# Patient Record
Sex: Female | Born: 1955 | Race: Asian | Hispanic: No | Smoking: Never smoker
Health system: Southern US, Community
[De-identification: ages and names within clinical notes are randomized; demographics above are authoritative.]

## PROBLEM LIST (undated history)

## (undated) DIAGNOSIS — E782 Mixed hyperlipidemia: Secondary | ICD-10-CM

## (undated) DIAGNOSIS — E1165 Type 2 diabetes mellitus with hyperglycemia: Secondary | ICD-10-CM

## (undated) DIAGNOSIS — I10 Essential (primary) hypertension: Secondary | ICD-10-CM

## (undated) DIAGNOSIS — Z91011 Allergy to milk products: Secondary | ICD-10-CM

## (undated) DIAGNOSIS — K2100 Gastro-esophageal reflux disease with esophagitis, without bleeding: Secondary | ICD-10-CM

## (undated) HISTORY — DX: Mixed hyperlipidemia: E78.2

## (undated) HISTORY — DX: Allergy to milk products: Z91.011

## (undated) HISTORY — PX: ABDOMINAL HYSTERECTOMY: SHX81

## (undated) HISTORY — DX: Gastro-esophageal reflux disease with esophagitis, without bleeding: K21.00

## (undated) HISTORY — DX: Type 2 diabetes mellitus with hyperglycemia: E11.65

## (undated) HISTORY — DX: Essential (primary) hypertension: I10

---

## 2020-06-08 ENCOUNTER — Other Ambulatory Visit: Payer: Self-pay

## 2020-06-08 ENCOUNTER — Observation Stay: Payer: Medicaid Other

## 2020-06-08 ENCOUNTER — Emergency Department: Payer: Medicaid Other

## 2020-06-08 ENCOUNTER — Observation Stay
Admission: EM | Admit: 2020-06-08 | Discharge: 2020-06-09 | Disposition: A | Discharge status: Home / Self Care | Payer: Medicaid Other | Attending: Internal Medicine | Admitting: Internal Medicine

## 2020-06-08 ENCOUNTER — Encounter: Payer: Self-pay | Admitting: Emergency Medicine

## 2020-06-08 DIAGNOSIS — Z20822 Contact with and (suspected) exposure to covid-19: Secondary | ICD-10-CM | POA: Insufficient documentation

## 2020-06-08 DIAGNOSIS — R5383 Other fatigue: Secondary | ICD-10-CM

## 2020-06-08 DIAGNOSIS — G459 Transient cerebral ischemic attack, unspecified: Principal | ICD-10-CM | POA: Diagnosis present

## 2020-06-08 DIAGNOSIS — E785 Hyperlipidemia, unspecified: Secondary | ICD-10-CM | POA: Insufficient documentation

## 2020-06-08 DIAGNOSIS — R531 Weakness: Secondary | ICD-10-CM | POA: Diagnosis present

## 2020-06-08 DIAGNOSIS — I10 Essential (primary) hypertension: Secondary | ICD-10-CM | POA: Diagnosis not present

## 2020-06-08 LAB — DIFFERENTIAL
Abs Immature Granulocytes: 0.02 10*3/uL (ref 0.00–0.07)
Basophils Absolute: 0 10*3/uL (ref 0.0–0.1)
Basophils Relative: 1 %
Eosinophils Absolute: 0.1 10*3/uL (ref 0.0–0.5)
Eosinophils Relative: 2 %
Immature Granulocytes: 0 %
Lymphocytes Relative: 29 %
Lymphs Abs: 1.8 10*3/uL (ref 0.7–4.0)
Monocytes Absolute: 0.4 10*3/uL (ref 0.1–1.0)
Monocytes Relative: 7 %
Neutro Abs: 3.7 10*3/uL (ref 1.7–7.7)
Neutrophils Relative %: 61 %

## 2020-06-08 LAB — COMPREHENSIVE METABOLIC PANEL
ALT: 40 U/L (ref 0–44)
AST: 33 U/L (ref 15–41)
Albumin: 3.9 g/dL (ref 3.5–5.0)
Alkaline Phosphatase: 53 U/L (ref 38–126)
Anion gap: 9 (ref 5–15)
BUN: 17 mg/dL (ref 8–23)
CO2: 25 mmol/L (ref 22–32)
Calcium: 9.1 mg/dL (ref 8.9–10.3)
Chloride: 103 mmol/L (ref 98–111)
Creatinine, Ser: 0.8 mg/dL (ref 0.44–1.00)
GFR, Estimated: >60 mL/min (ref >60)
Glucose, Bld: 140 mg/dL — ABNORMAL HIGH (ref 70–99)
Potassium: 4 mmol/L (ref 3.5–5.1)
Sodium: 137 mmol/L (ref 135–145)
Total Bilirubin: 0.4 mg/dL (ref 0.3–1.2)
Total Protein: 7.5 g/dL (ref 6.5–8.1)

## 2020-06-08 LAB — RESP PANEL BY RT-PCR (FLU A&B, COVID) ARPGX2
Influenza A by PCR: NEGATIVE
Influenza B by PCR: NEGATIVE
SARS Coronavirus 2 by RT PCR: NEGATIVE

## 2020-06-08 LAB — CBC
HCT: 39.8 % (ref 36.0–46.0)
Hemoglobin: 12.7 g/dL (ref 12.0–15.0)
MCH: 25.1 pg — ABNORMAL LOW (ref 26.0–34.0)
MCHC: 31.9 g/dL (ref 30.0–36.0)
MCV: 78.7 fL — ABNORMAL LOW (ref 80.0–100.0)
Platelets: 235 10*3/uL (ref 150–400)
RBC: 5.06 MIL/uL (ref 3.87–5.11)
RDW: 14.6 % (ref 11.5–15.5)
WBC: 6 10*3/uL (ref 4.0–10.5)
nRBC: 0 % (ref 0.0–0.2)

## 2020-06-08 LAB — LDL CHOLESTEROL, DIRECT: Direct LDL: 152.5 mg/dL — ABNORMAL HIGH (ref 0–99)

## 2020-06-08 LAB — APTT: aPTT: 28 seconds (ref 24–36)

## 2020-06-08 LAB — PROTIME-INR
INR: 1 (ref 0.8–1.2)
Prothrombin Time: 12.8 seconds (ref 11.4–15.2)

## 2020-06-08 IMAGING — CT CT HEAD W/O CM
3 series · 15 of 46 positions shown, 18 images · non-contrast
Comparison: None.

CLINICAL DATA: TIA symptoms, headache

EXAM:
CT HEAD WITHOUT CONTRAST
TECHNIQUE: Contiguous axial images were obtained from the base of the skull
through the vertex without intravenous contrast.

[Series 3: head wo · axial · 0.41mm/px · z∈[-210,-90]mm · 9 of 29 slices shown, 12 images]
[im 3/29  brain]
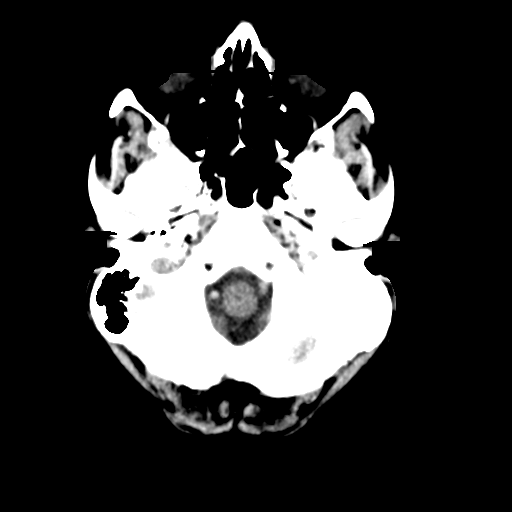
[im 3/29  bone]
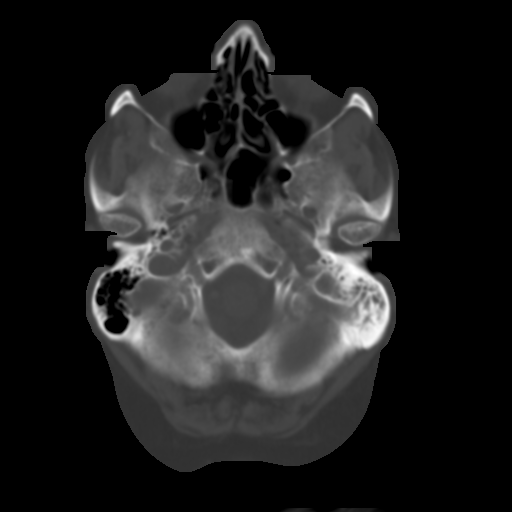
[im 6/29  brain]
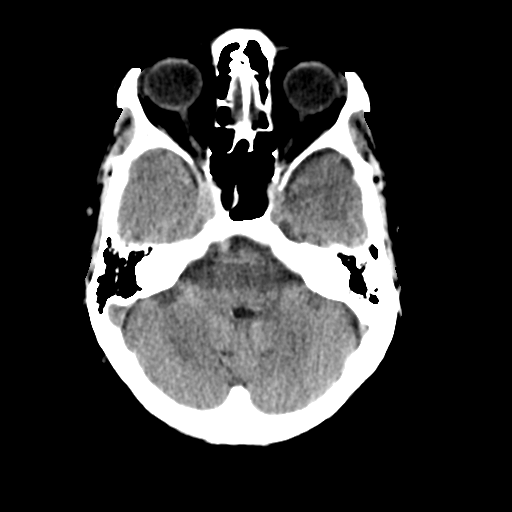
[im 9/29  brain]
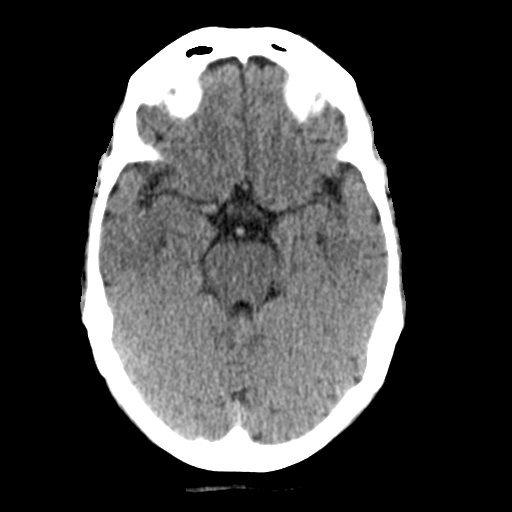
[im 12/29  brain]
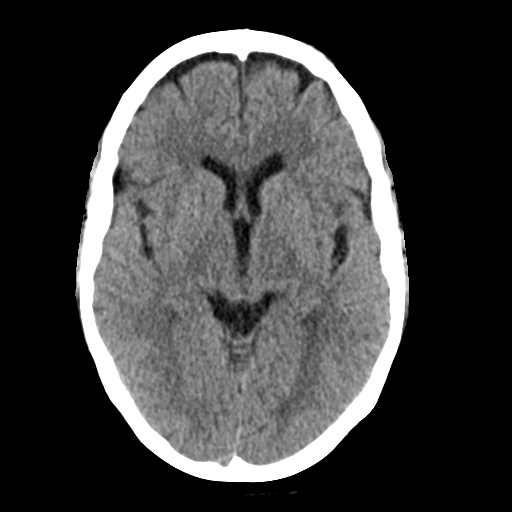
[im 15/29  brain]
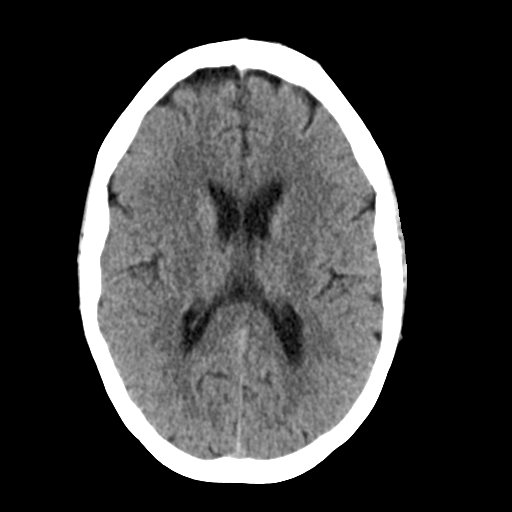
[im 15/29  bone]
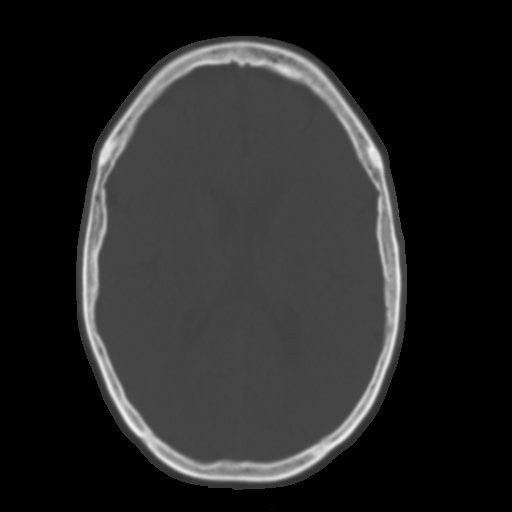
[im 18/29  brain]
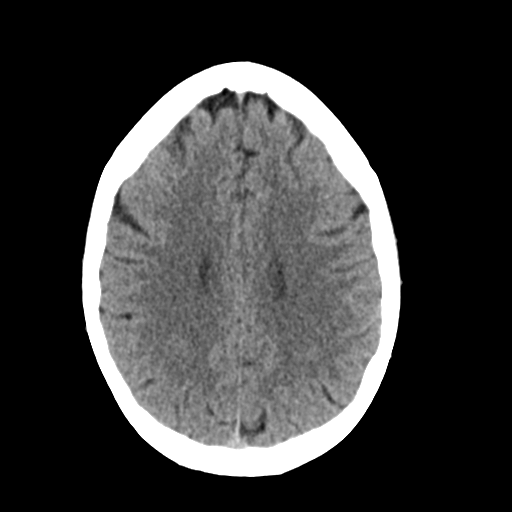
[im 21/29  brain]
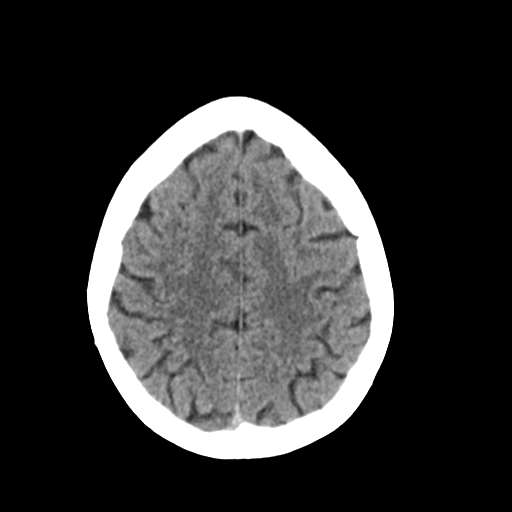
[im 24/29  brain]
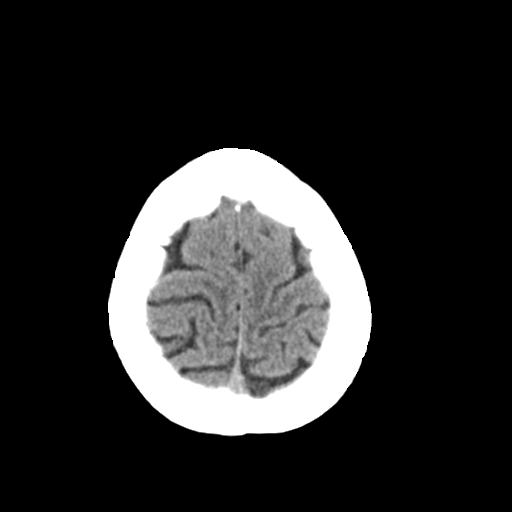
[im 27/29  brain]
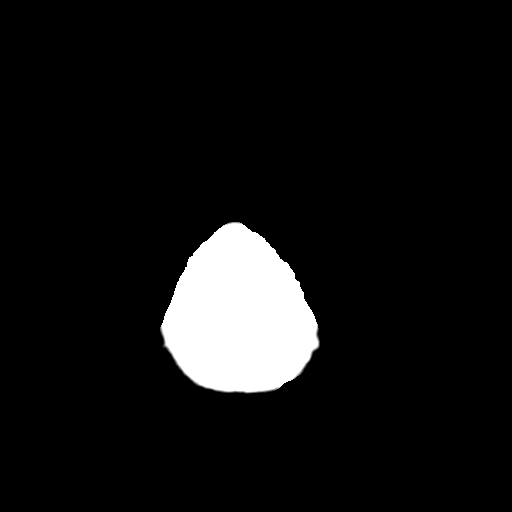
[im 27/29  bone]
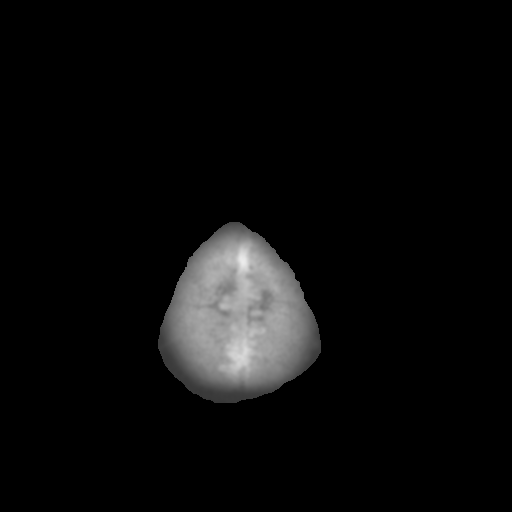

[Series 4: coronal soft tissue · coronal · 0.29mm/px · 3 of 68 slices shown]
[im 23/68  brain]
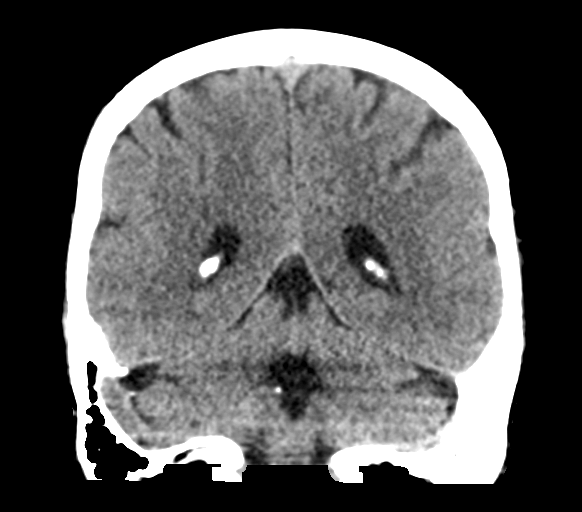
[im 30/68  brain]
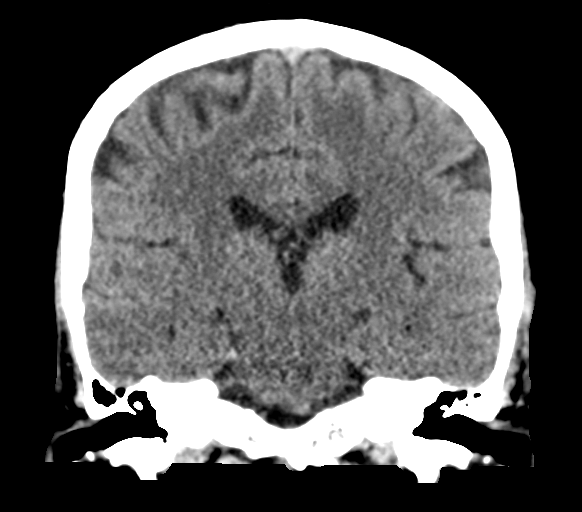
[im 38/68  brain]
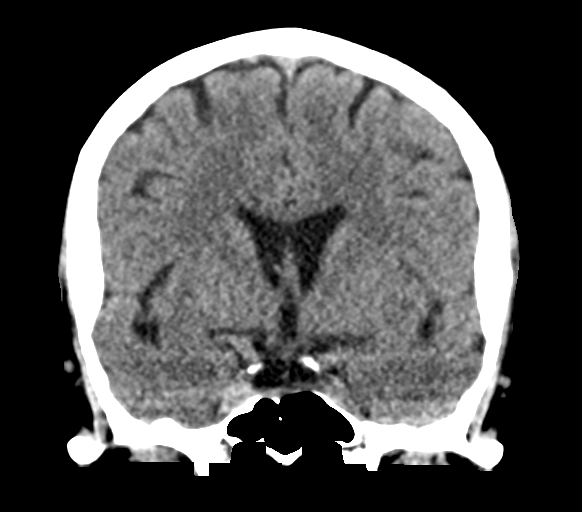

[Series 5: sagittal soft tissue · sagittal · 0.30mm/px · 3 of 56 slices shown]
[im 19/56  brain]
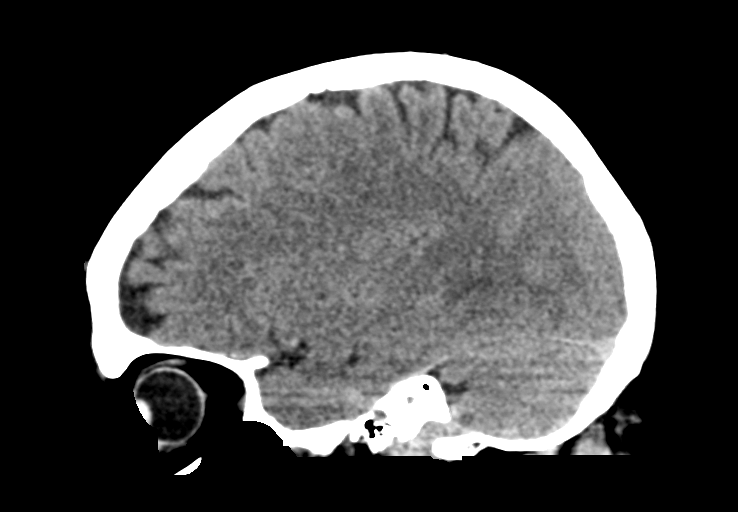
[im 28/56  brain]
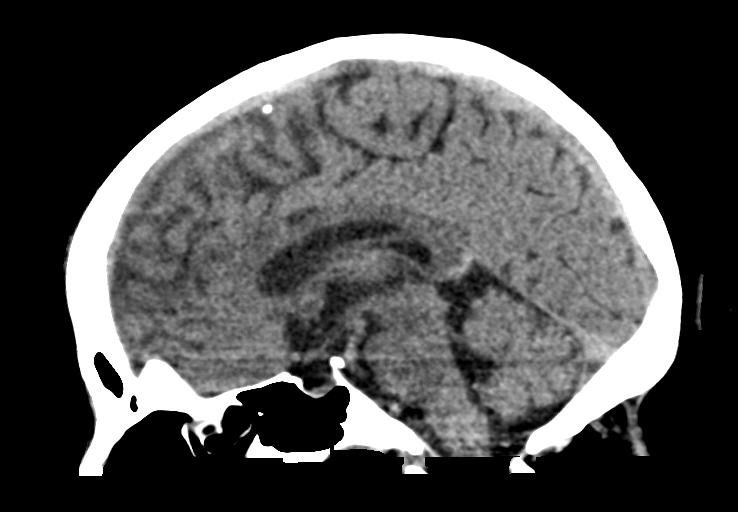
[im 37/56  brain]
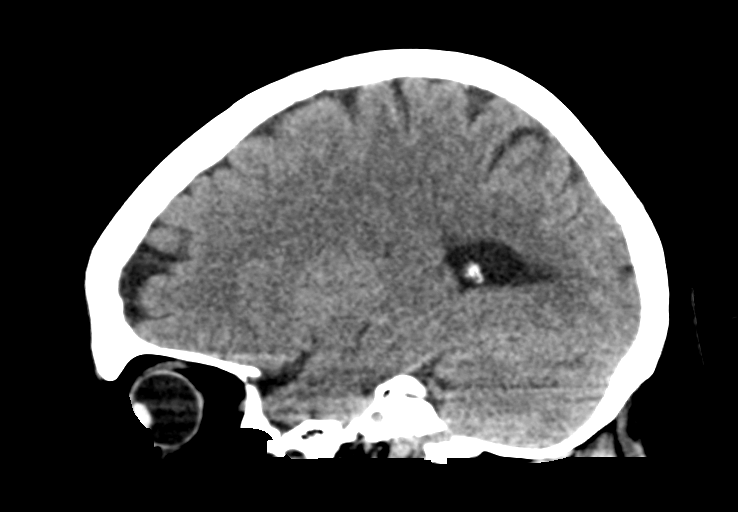

[15 of 46 positions shown; findings below may reference images not displayed]

FINDINGS: Brain: No evidence of acute infarction, hemorrhage, hydrocephalus,
extra-axial collection or mass lesion/mass effect.

Vascular: No hyperdense vessel or unexpected calcification.

Skull: Normal. Negative for fracture or focal lesion.

Sinuses/Orbits: No acute finding.

Other: None.
IMPRESSION: Normal head CT without contrast for age.

## 2020-06-08 IMAGING — MR MR HEAD W/O CM
12 series · 48 of 48 positions shown · IV contrast (gadavist)
Comparison: Head CT earlier same day

CLINICAL DATA: Left-sided weakness, headache and speech
disturbance.

EXAM:
MRI HEAD WITHOUT CONTRAST
MRA HEAD WITHOUT CONTRAST
MRA NECK WITHOUT AND WITH CONTRAST
TECHNIQUE: Multiplanar, multiecho pulse sequences of the brain and surrounding
structures were obtained without and with intravenous contrast.
Angiographic images of the Circle of Willis were obtained using MRA
technique without intravenous contrast. Angiographic images of the
neck were obtained using MRA technique without and with intravenous
contrast. Carotid stenosis measurements (when applicable) are
obtained utilizing NASCET criteria, using the distal internal
carotid diameter as the denominator.
CONTRAST:  7.5mL GADAVIST GADOBUTROL 1 MMOL/ML IV SOLN

[Series 5: ax dwi_tracew · axial · 3.0mm · 0.65mm/px · z∈[-139,+15]mm · 4 of 48 slices shown]
[im 1/48]
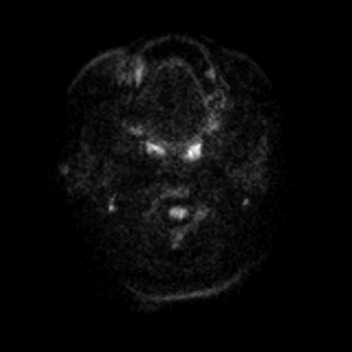
[im 16/48]
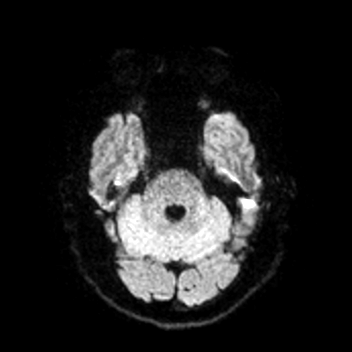
[im 32/48]
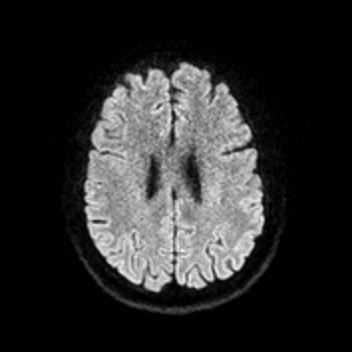
[im 48/48]
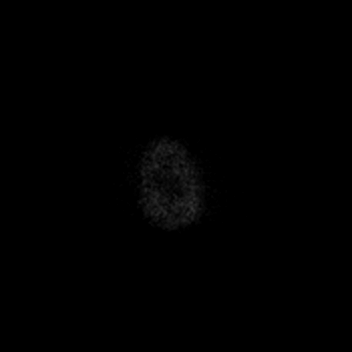

[Series 6: ax dwi_adc · axial · 3.0mm · 0.65mm/px · z∈[-139,+15]mm · 4 of 48 slices shown]
[im 1/48]
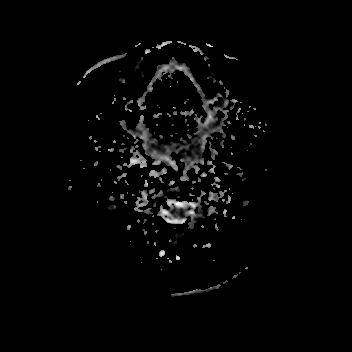
[im 16/48]
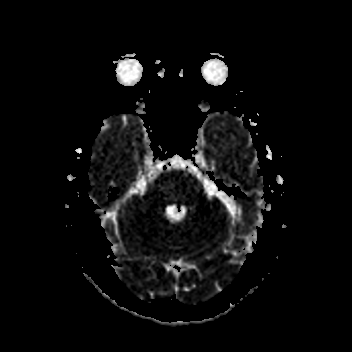
[im 32/48]
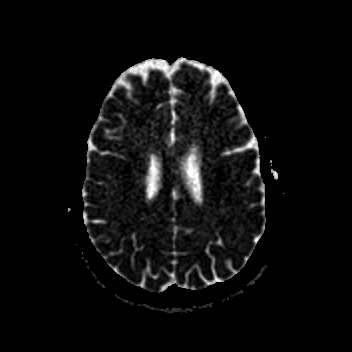
[im 48/48]
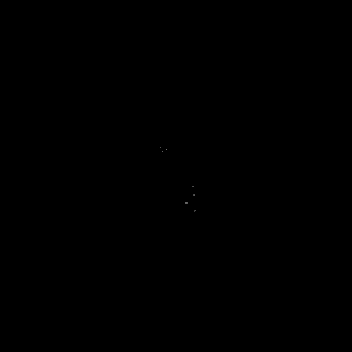

[Series 7: cor dwi_tracew · coronal · 5.0mm · 0.68mm/px · 3 of 40 slices shown]
[im 1/40]
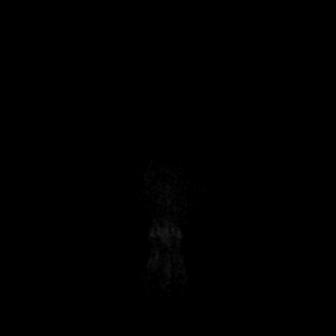
[im 20/40]
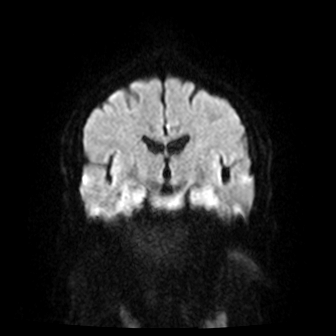
[im 40/40]
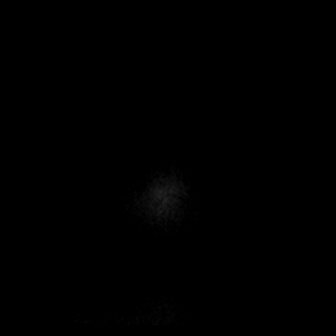

[Series 8: cor dwi_adc · coronal · 5.0mm · 0.68mm/px · 3 of 39 slices shown]
[im 1/39]
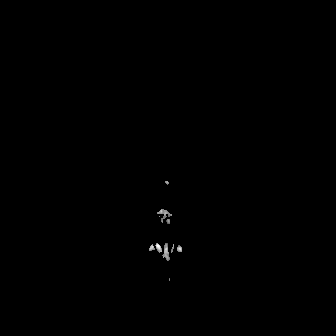
[im 20/39]
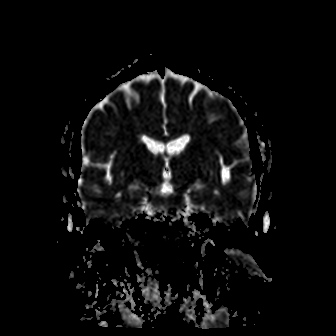
[im 39/39]
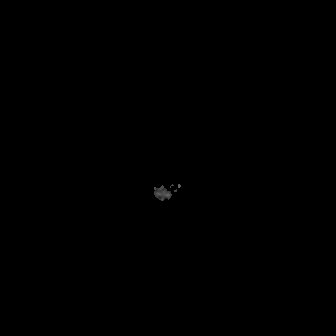

[Series 9: T1 · sagittal · 5.0mm · 0.62mm/px · 2 of 22 slices shown (1 of 2)]
[im 1/22]
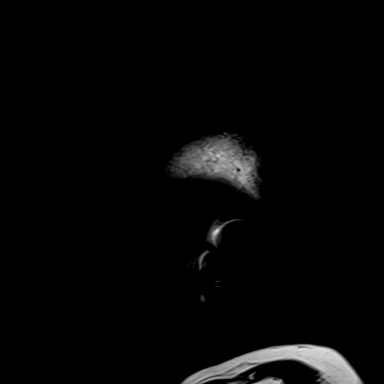
[im 22/22]
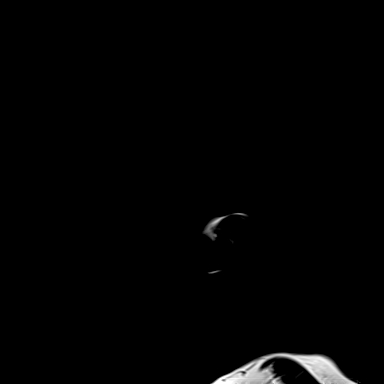

[Series 10: T2 · axial · 5.0mm · 0.53mm/px · z∈[-132,+10]mm · 2 of 25 slices shown (1 of 2)]
[im 1/25]
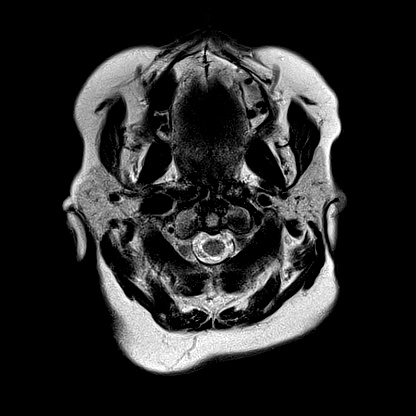
[im 25/25]
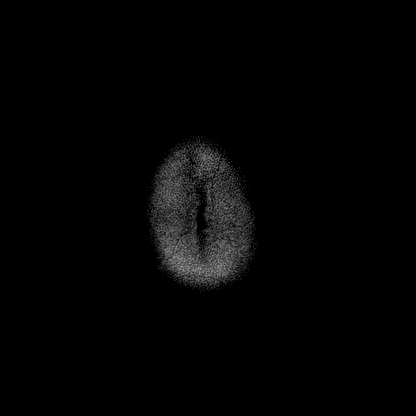

[Series 11: mag_images · axial · 3.0mm · 0.90mm/px · z∈[-148,+27]mm · 4 of 60 slices shown]
[im 1/60]
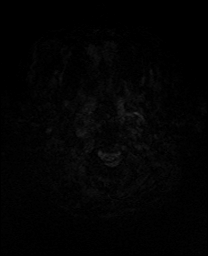
[im 20/60]
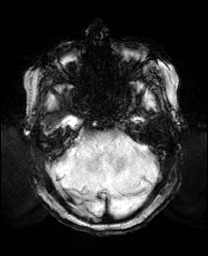
[im 40/60]
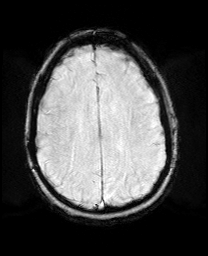
[im 60/60]
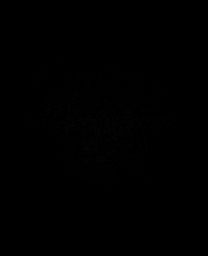

[Series 12: pha_images · axial · 3.0mm · 0.90mm/px · z∈[-148,+12]mm · 4 of 55 slices shown]
[im 1/55]
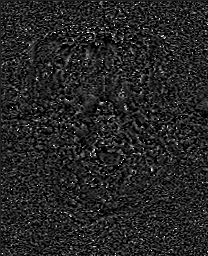
[im 19/55]
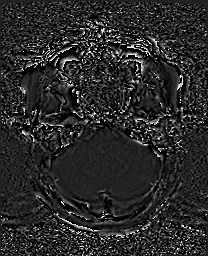
[im 37/55]
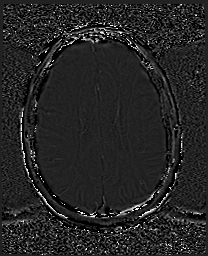
[im 55/55]
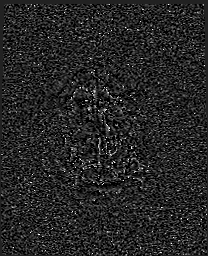

[Series 13: swi_images · axial · 3.0mm · 0.90mm/px · z∈[-148,+27]mm · 4 of 60 slices shown]
[im 1/60]
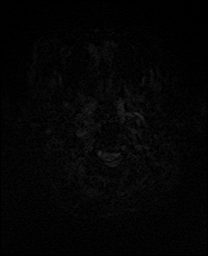
[im 20/60]
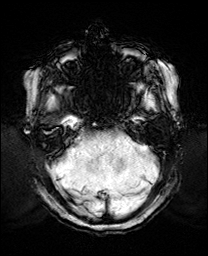
[im 40/60]
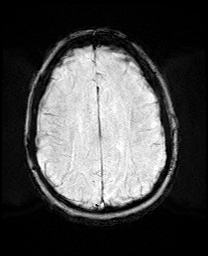
[im 60/60]
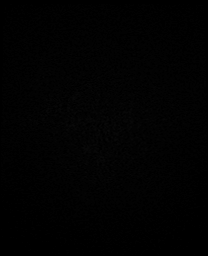

[Series 15: FLAIR · axial · 3.0mm · 0.53mm/px · z∈[-140,+20]mm · 4 of 55 slices shown]
[im 1/55]
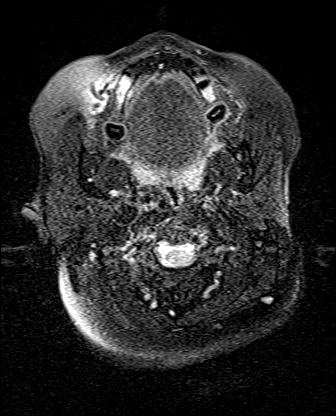
[im 19/55]
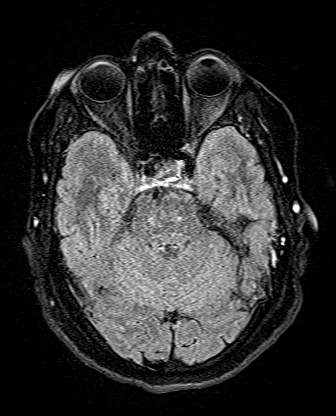
[im 37/55]
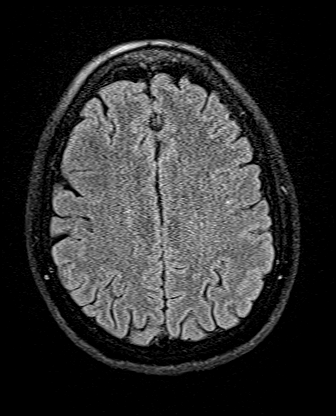
[im 55/55]
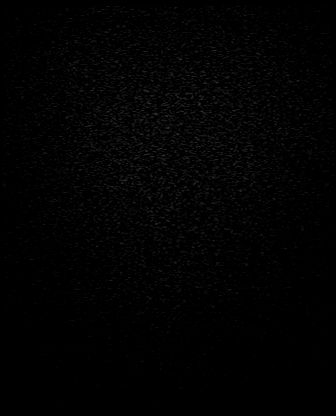

[Series 16: T1 · axial · 1.0mm · 0.98mm/px · z∈[-133,+24]mm · 12 of 160 slices shown (2 of 2)]
[im 1/160]
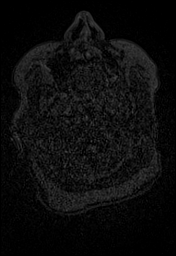
[im 15/160]
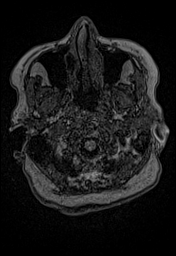
[im 29/160]
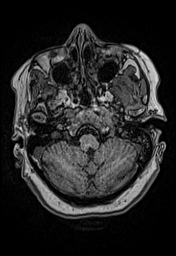
[im 44/160]
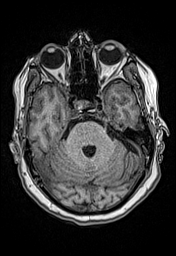
[im 58/160]
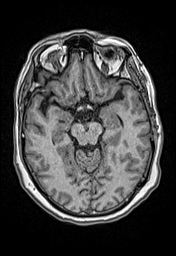
[im 73/160]
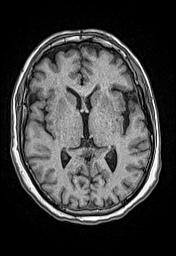
[im 87/160]
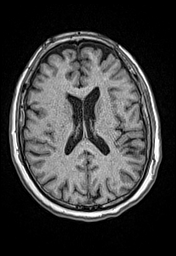
[im 102/160]
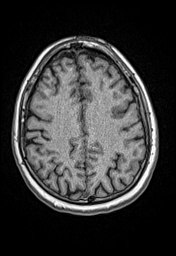
[im 116/160]
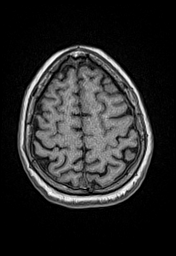
[im 131/160]
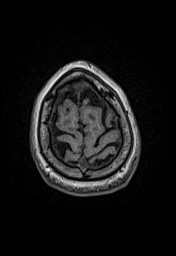
[im 145/160]
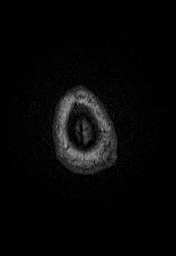
[im 160/160]
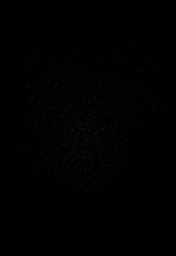

[Series 17: T2 · coronal · 5.0mm · 0.57mm/px · 2 of 29 slices shown (2 of 2)]
[im 1/29]
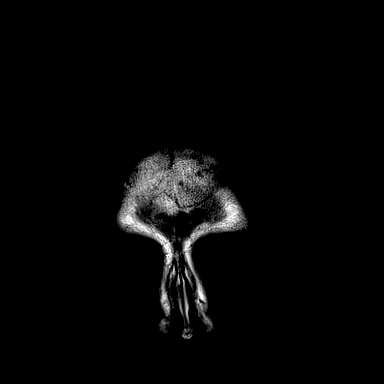
[im 29/29]
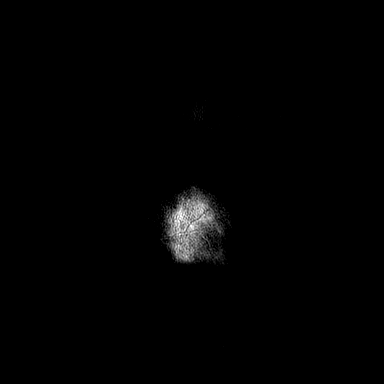

[48 of 48 positions shown; findings below may reference images not displayed]

FINDINGS: MRI HEAD FINDINGS

Brain: Diffusion imaging does not show any acute or subacute
infarction. There are mild chronic small-vessel ischemic changes of
the pons. Very few foci of T2 and FLAIR signal within the
hemispheric white matter consistent with minimal small vessel
change. No cortical or large vessel territory infarction. No mass
lesion, hemorrhage, hydrocephalus or extra-axial collection.

Vascular: Major vessels at the base of the brain show flow.

Skull and upper cervical spine: Negative

Sinuses/Orbits: Paranasal sinuses are clear.  Orbits appear normal.

Other: Left mastoid effusion.

MRA HEAD FINDINGS

Both internal carotid arteries widely patent through the skull base
and siphon regions. There is either an infundibulum or a 2 mm
aneurysm at the left posterior communicating artery origin. Anterior
and middle cerebral vessels appear normal.

Both vertebral arteries widely patent to the basilar. No basilar
stenosis. Posterior circulation branch vessels are patent. Some
irregularity of the more distal PCA branches.

MRA NECK FINDINGS

Branching pattern is normal. No origin stenosis. Both common carotid
arteries widely patent to the bifurcation. The vessels are tortuous
as might be seen with hypertension. No carotid bifurcation stenosis
or irregularity. Cervical internal carotid arteries are tortuous but
widely patent. No sign of dissection.

Both vertebral artery origins are poorly seen because of chest
motion. Beyond the origins, the vessels are widely patent through
the cervical region to the foramen magnum. The vessels are somewhat
tortuous but there is no sign of vertebral dissection.
IMPRESSION: MRI head: No acute finding. Mild chronic small-vessel change of the
pons and cerebral hemispheric white matter.

MRA head: No large or medium vessel occlusion. 2 mm aneurysm or
infundibulum at the left posterior communicating artery origin.

MRA neck: No sign of dissection. The vessels are tortuous, as might
be seen with a history of hypertension. No carotid bifurcation
stenosis or irregularity. Vertebral artery origins are not well seen
due to chest motion.

## 2020-06-08 IMAGING — MR MR MRA HEAD W/O CM
1 series · 18 of 48 positions shown · IV contrast (gadavist)
Comparison: Head CT earlier same day

CLINICAL DATA: Left-sided weakness, headache and speech
disturbance.

EXAM:
MRI HEAD WITHOUT CONTRAST
MRA HEAD WITHOUT CONTRAST
MRA NECK WITHOUT AND WITH CONTRAST
TECHNIQUE: Multiplanar, multiecho pulse sequences of the brain and surrounding
structures were obtained without and with intravenous contrast.
Angiographic images of the Circle of Willis were obtained using MRA
technique without intravenous contrast. Angiographic images of the
neck were obtained using MRA technique without and with intravenous
contrast. Carotid stenosis measurements (when applicable) are
obtained utilizing NASCET criteria, using the distal internal
carotid diameter as the denominator.
CONTRAST:  7.5mL GADAVIST GADOBUTROL 1 MMOL/ML IV SOLN

[Series 1: TOF · axial · 0.5mm · 0.41mm/px · z∈[-126,-29]mm · 18 of 205 slices shown]
[im 1/205]
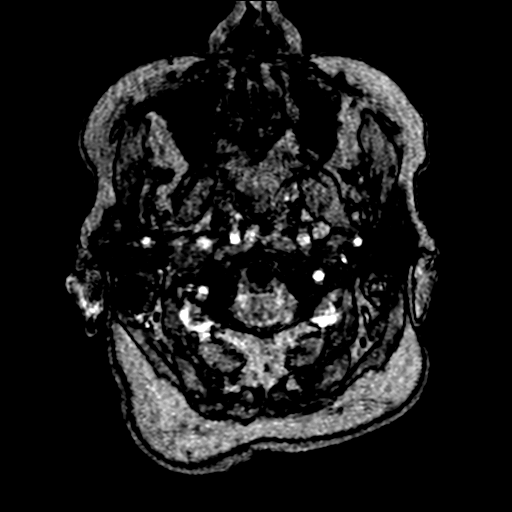
[im 5/205]
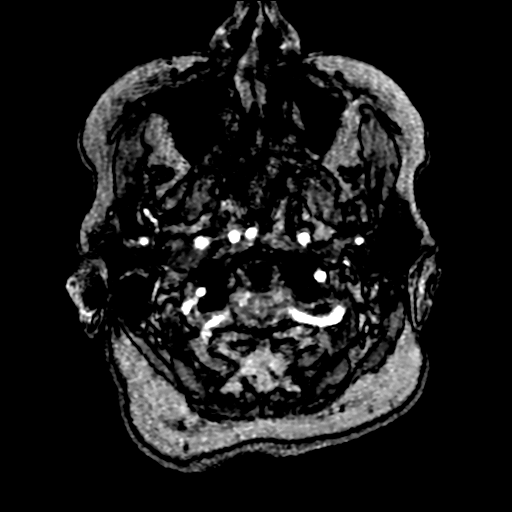
[im 9/205]
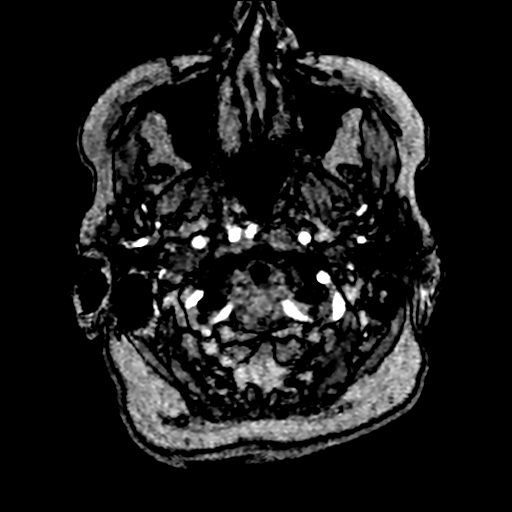
[im 14/205]
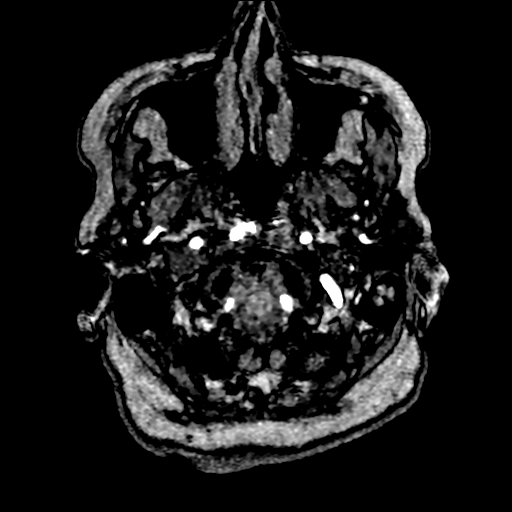
[im 18/205]
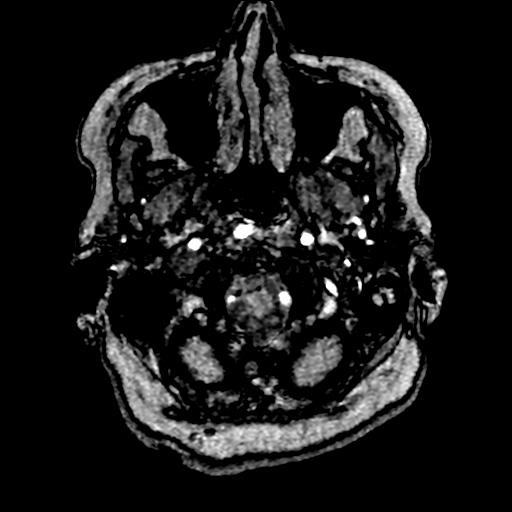
[im 22/205]
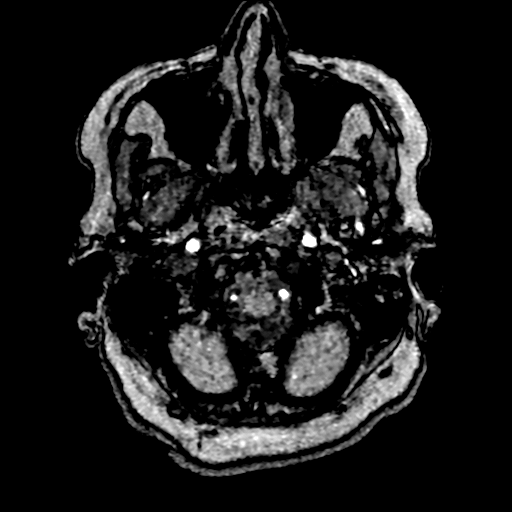
[im 27/205]
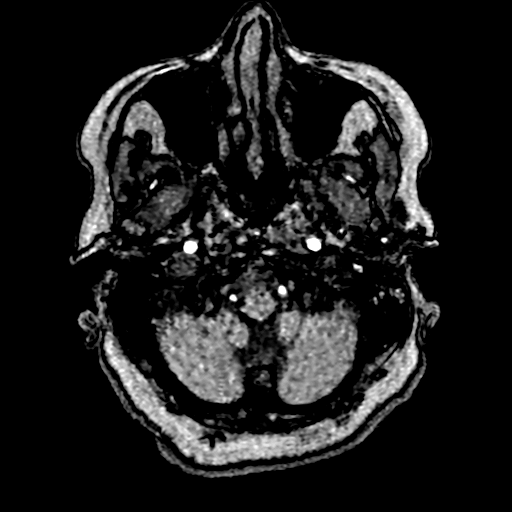
[im 31/205]
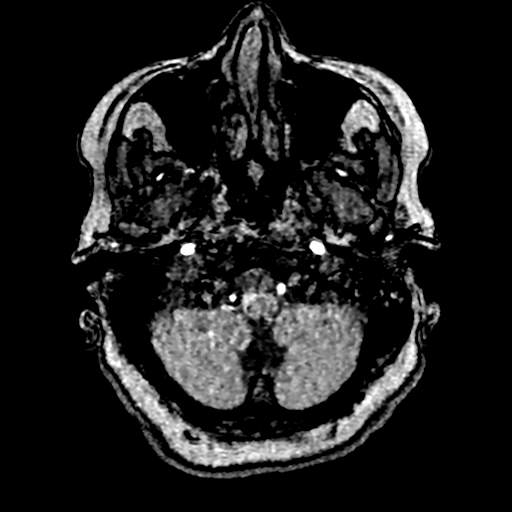
[im 35/205]
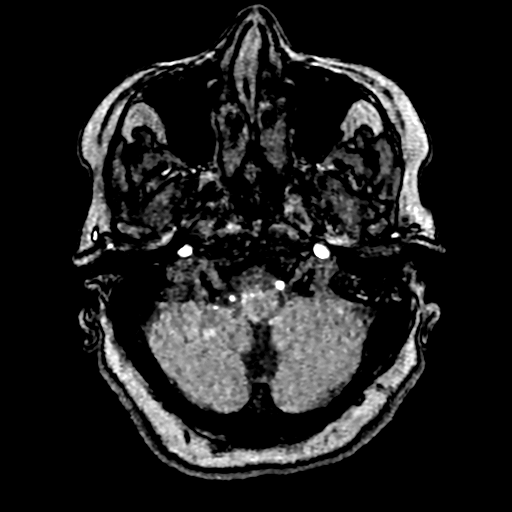
[im 40/205]
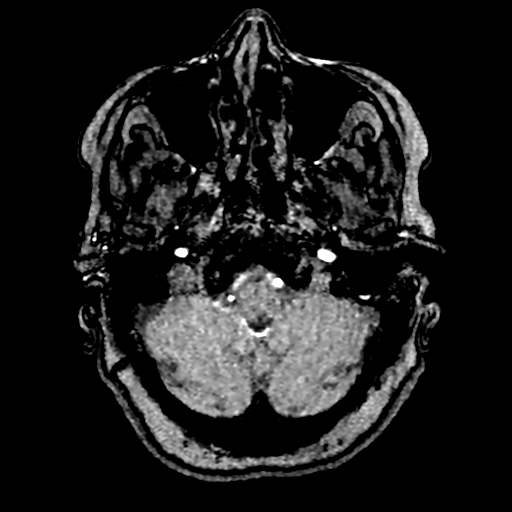
[im 66/205]
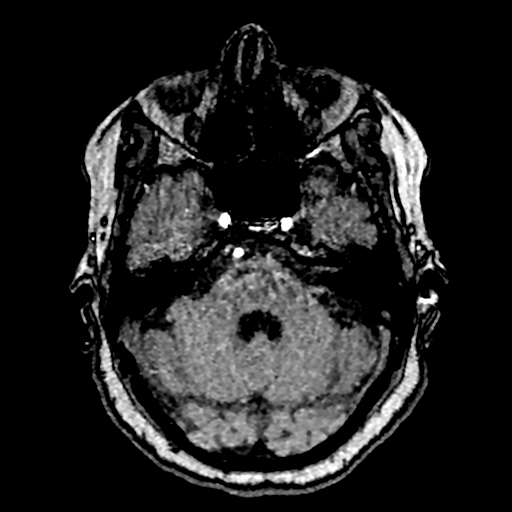
[im 92/205]
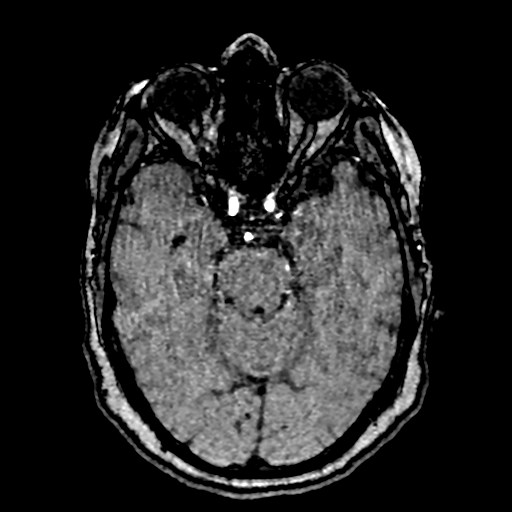
[im 105/205]
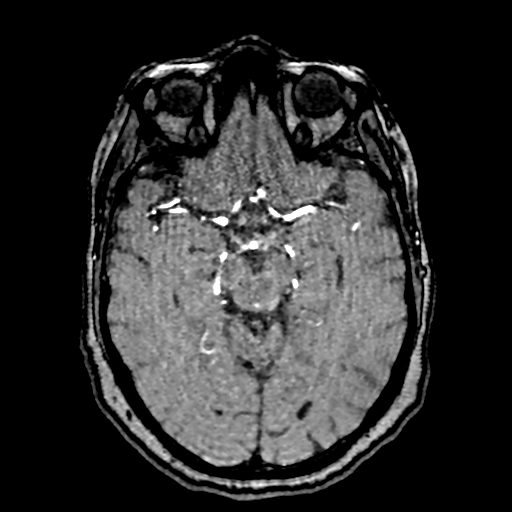
[im 118/205]
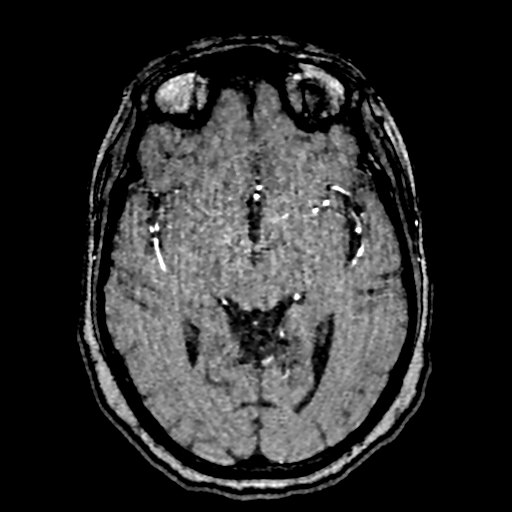
[im 144/205]
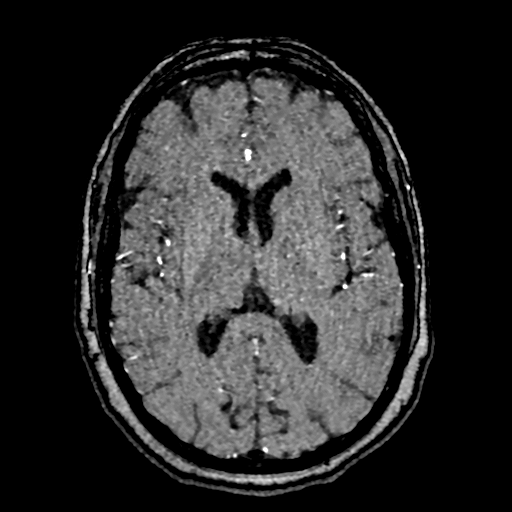
[im 170/205]
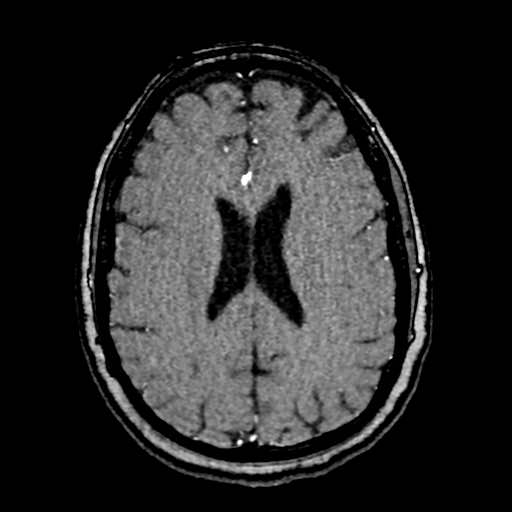
[im 174/205]
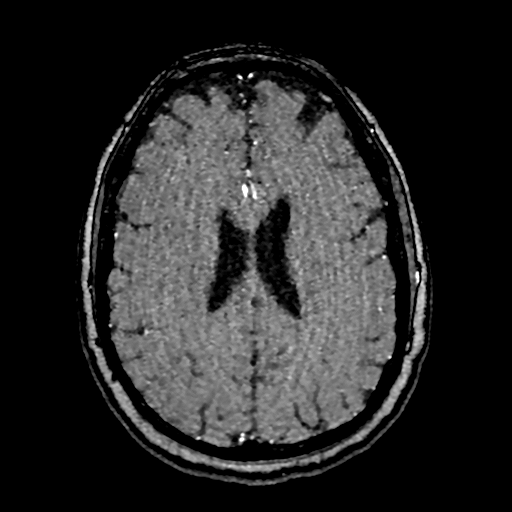
[im 196/205]
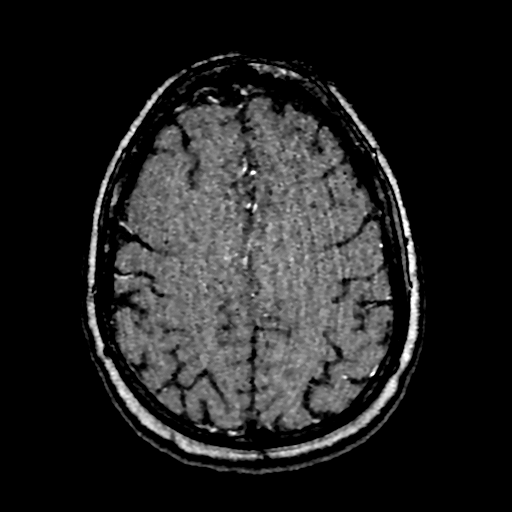

[18 of 48 positions shown; findings below may reference images not displayed]

FINDINGS: MRI HEAD FINDINGS

Brain: Diffusion imaging does not show any acute or subacute
infarction. There are mild chronic small-vessel ischemic changes of
the pons. Very few foci of T2 and FLAIR signal within the
hemispheric white matter consistent with minimal small vessel
change. No cortical or large vessel territory infarction. No mass
lesion, hemorrhage, hydrocephalus or extra-axial collection.

Vascular: Major vessels at the base of the brain show flow.

Skull and upper cervical spine: Negative

Sinuses/Orbits: Paranasal sinuses are clear.  Orbits appear normal.

Other: Left mastoid effusion.

MRA HEAD FINDINGS

Both internal carotid arteries widely patent through the skull base
and siphon regions. There is either an infundibulum or a 2 mm
aneurysm at the left posterior communicating artery origin. Anterior
and middle cerebral vessels appear normal.

Both vertebral arteries widely patent to the basilar. No basilar
stenosis. Posterior circulation branch vessels are patent. Some
irregularity of the more distal PCA branches.

MRA NECK FINDINGS

Branching pattern is normal. No origin stenosis. Both common carotid
arteries widely patent to the bifurcation. The vessels are tortuous
as might be seen with hypertension. No carotid bifurcation stenosis
or irregularity. Cervical internal carotid arteries are tortuous but
widely patent. No sign of dissection.

Both vertebral artery origins are poorly seen because of chest
motion. Beyond the origins, the vessels are widely patent through
the cervical region to the foramen magnum. The vessels are somewhat
tortuous but there is no sign of vertebral dissection.
IMPRESSION: MRI head: No acute finding. Mild chronic small-vessel change of the
pons and cerebral hemispheric white matter.

MRA head: No large or medium vessel occlusion. 2 mm aneurysm or
infundibulum at the left posterior communicating artery origin.

MRA neck: No sign of dissection. The vessels are tortuous, as might
be seen with a history of hypertension. No carotid bifurcation
stenosis or irregularity. Vertebral artery origins are not well seen
due to chest motion.

## 2020-06-08 IMAGING — MR MR MRA NECK WO/W CM
4 of 5 series · 36 of 48 positions shown · IV contrast (7.5ml Gadavist)
Comparison: Head CT earlier same day

CLINICAL DATA: Left-sided weakness, headache and speech
disturbance.

EXAM:
MRI HEAD WITHOUT CONTRAST
MRA HEAD WITHOUT CONTRAST
MRA NECK WITHOUT AND WITH CONTRAST
TECHNIQUE: Multiplanar, multiecho pulse sequences of the brain and surrounding
structures were obtained without and with intravenous contrast.
Angiographic images of the Circle of Willis were obtained using MRA
technique without intravenous contrast. Angiographic images of the
neck were obtained using MRA technique without and with intravenous
contrast. Carotid stenosis measurements (when applicable) are
obtained utilizing NASCET criteria, using the distal internal
carotid diameter as the denominator.
CONTRAST:  7.5mL GADAVIST GADOBUTROL 1 MMOL/ML IV SOLN

[Series 9: angio_fl3d_cor_pre_ttc=2.0s · coronal · 0.9mm · 0.85mm/px · 9 of 96 slices shown]
[im 1/96]
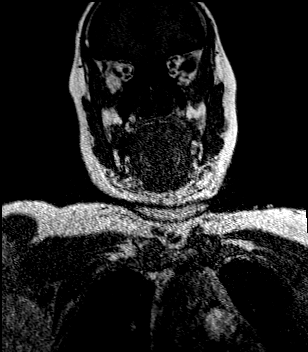
[im 12/96]
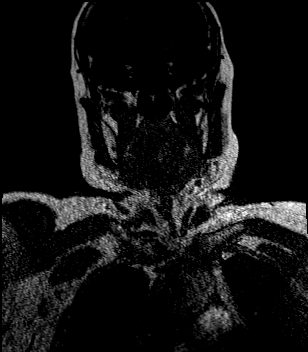
[im 24/96]
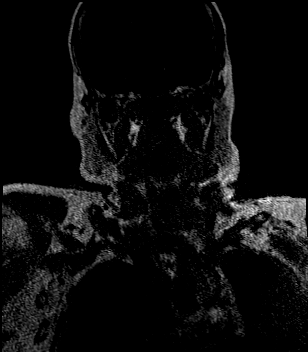
[im 36/96]
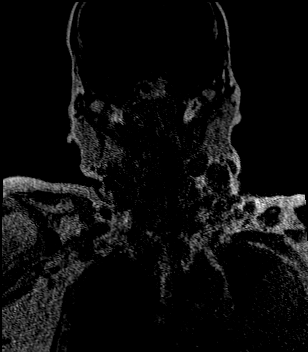
[im 48/96]
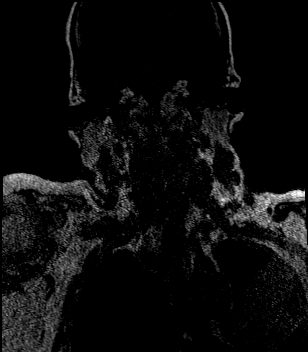
[im 60/96]
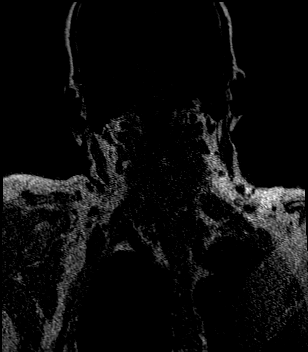
[im 72/96]
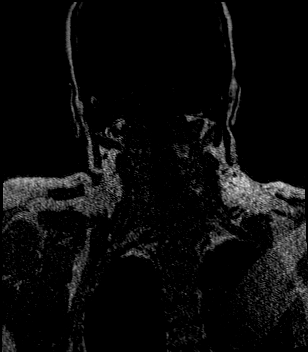
[im 84/96]
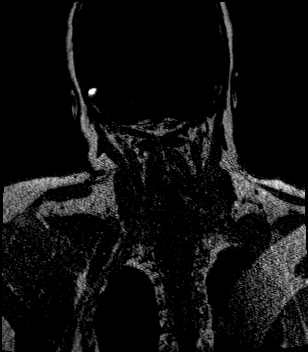
[im 96/96]
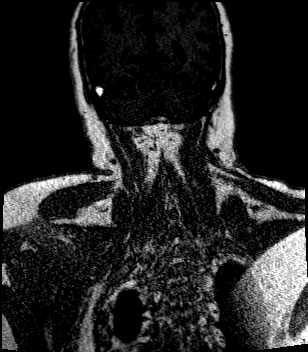

[Series 11: angio_fl3d_cor_post_ttc=2.0s · coronal · 0.9mm · 0.85mm/px · 9 of 96 slices shown]
[im 1/96]
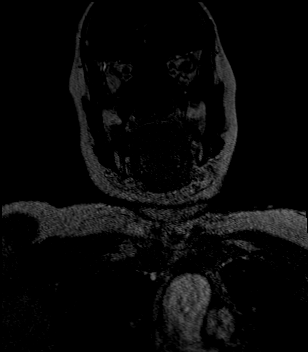
[im 12/96]
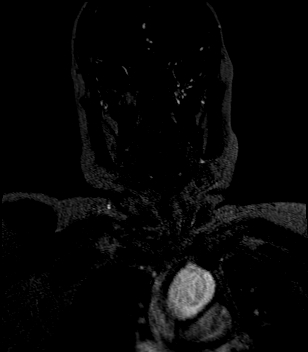
[im 24/96]
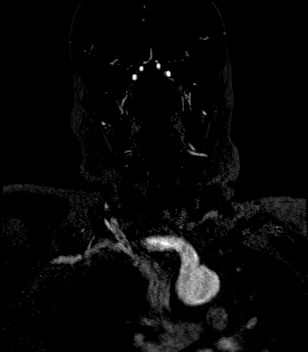
[im 36/96]
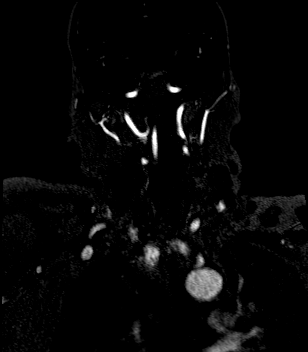
[im 48/96]
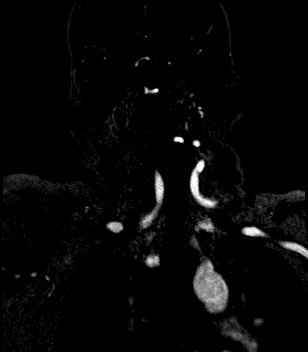
[im 60/96]
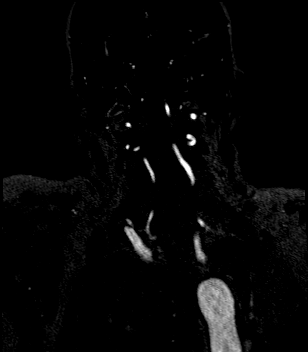
[im 72/96]
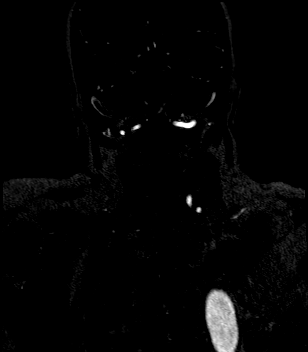
[im 84/96]
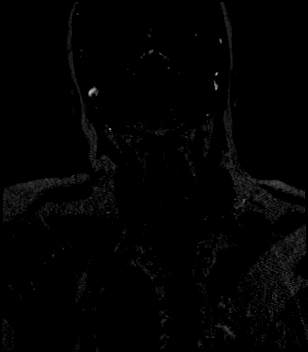
[im 96/96]
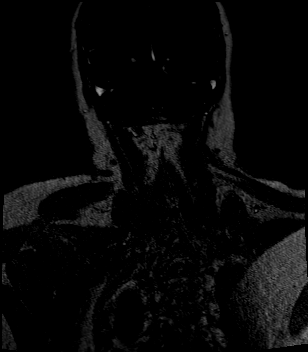

[Series 12: angio_fl3d_cor_post_ttc=2.0s_moco-adv · coronal · 0.9mm · 0.85mm/px · 9 of 96 slices shown]
[im 1/96]
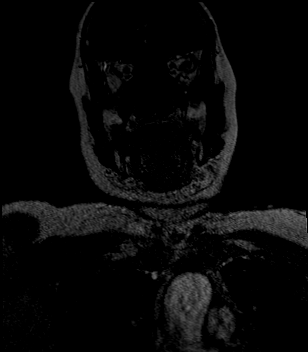
[im 12/96]
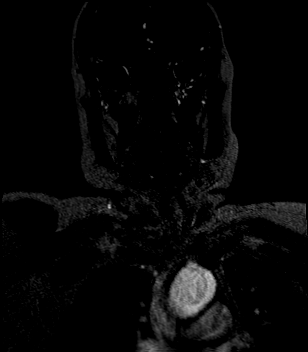
[im 24/96]
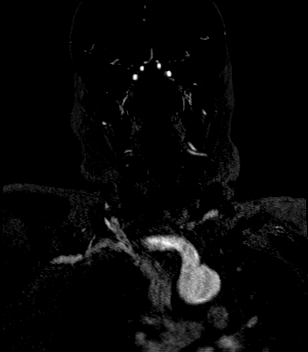
[im 36/96]
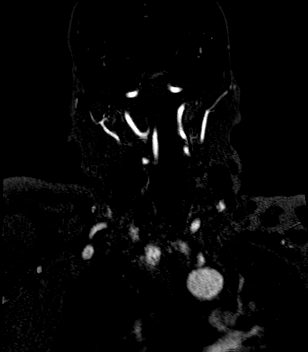
[im 48/96]
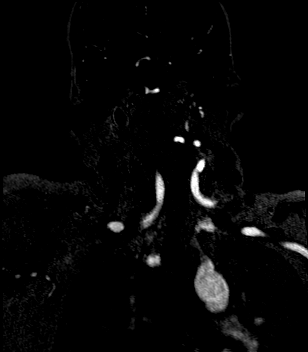
[im 60/96]
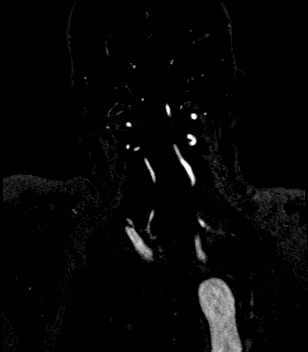
[im 72/96]
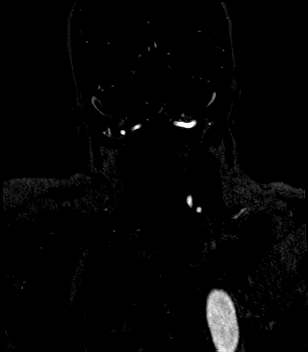
[im 84/96]
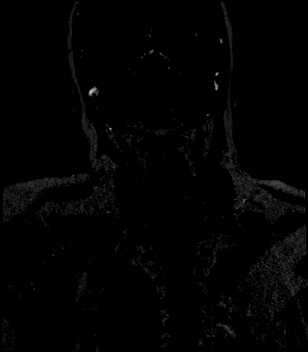
[im 96/96]
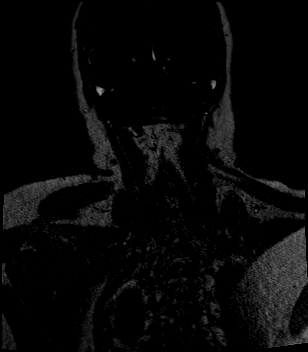

[Series 13: angio_fl3d_cor_post_ttc=2.0s_moco-adv_sub · coronal · 0.9mm · 0.85mm/px · 9 of 96 slices shown]
[im 1/96]
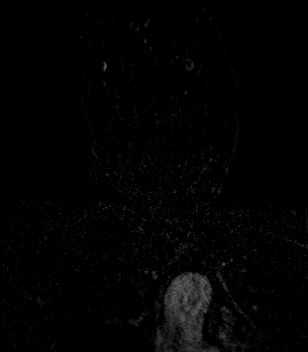
[im 12/96]
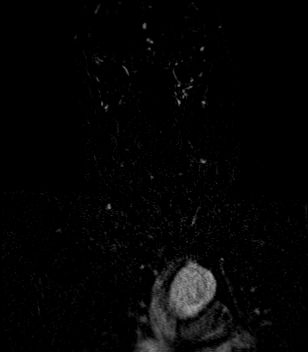
[im 24/96]
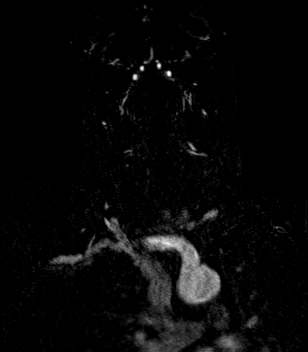
[im 36/96]
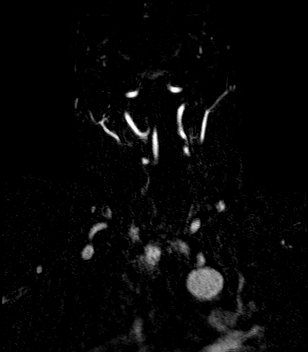
[im 48/96]
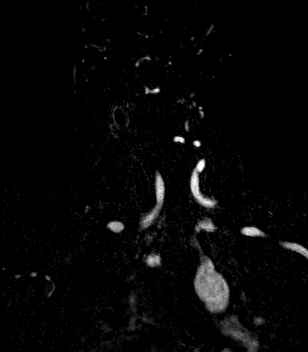
[im 60/96]
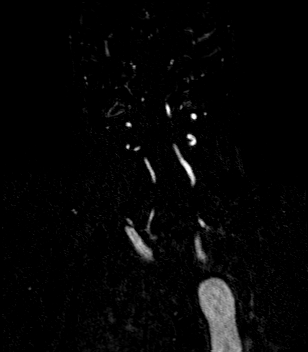
[im 72/96]
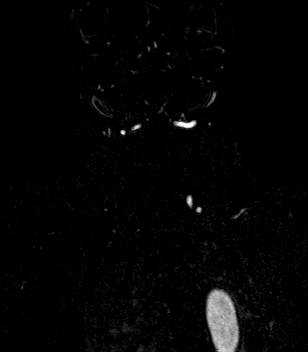
[im 84/96]
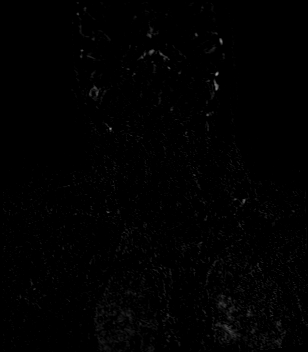
[im 96/96]
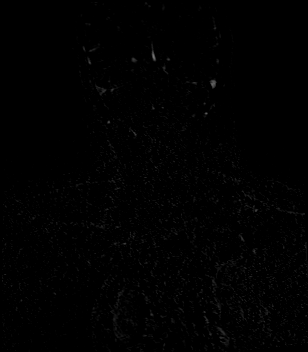

[36 of 48 positions shown; findings below may reference images not displayed]

FINDINGS: MRI HEAD FINDINGS

Brain: Diffusion imaging does not show any acute or subacute
infarction. There are mild chronic small-vessel ischemic changes of
the pons. Very few foci of T2 and FLAIR signal within the
hemispheric white matter consistent with minimal small vessel
change. No cortical or large vessel territory infarction. No mass
lesion, hemorrhage, hydrocephalus or extra-axial collection.

Vascular: Major vessels at the base of the brain show flow.

Skull and upper cervical spine: Negative

Sinuses/Orbits: Paranasal sinuses are clear.  Orbits appear normal.

Other: Left mastoid effusion.

MRA HEAD FINDINGS

Both internal carotid arteries widely patent through the skull base
and siphon regions. There is either an infundibulum or a 2 mm
aneurysm at the left posterior communicating artery origin. Anterior
and middle cerebral vessels appear normal.

Both vertebral arteries widely patent to the basilar. No basilar
stenosis. Posterior circulation branch vessels are patent. Some
irregularity of the more distal PCA branches.

MRA NECK FINDINGS

Branching pattern is normal. No origin stenosis. Both common carotid
arteries widely patent to the bifurcation. The vessels are tortuous
as might be seen with hypertension. No carotid bifurcation stenosis
or irregularity. Cervical internal carotid arteries are tortuous but
widely patent. No sign of dissection.

Both vertebral artery origins are poorly seen because of chest
motion. Beyond the origins, the vessels are widely patent through
the cervical region to the foramen magnum. The vessels are somewhat
tortuous but there is no sign of vertebral dissection.
IMPRESSION: MRI head: No acute finding. Mild chronic small-vessel change of the
pons and cerebral hemispheric white matter.

MRA head: No large or medium vessel occlusion. 2 mm aneurysm or
infundibulum at the left posterior communicating artery origin.

MRA neck: No sign of dissection. The vessels are tortuous, as might
be seen with a history of hypertension. No carotid bifurcation
stenosis or irregularity. Vertebral artery origins are not well seen
due to chest motion.

## 2020-06-08 MED ORDER — ACETAMINOPHEN 160 MG/5ML PO SOLN
650.0000 mg | ORAL | Status: DC | PRN
  Filled 2020-06-08: qty 20.3

## 2020-06-08 MED ORDER — STROKE: EARLY STAGES OF RECOVERY BOOK: Freq: Once | Status: AC

## 2020-06-08 MED ORDER — ATORVASTATIN CALCIUM 20 MG PO TABS
40.0000 mg | ORAL_TABLET | Freq: Every day | ORAL | Status: DC
  Administered 2020-06-08 – 2020-06-09 (×2): 40 mg via ORAL
  Filled 2020-06-08 (×2): qty 2

## 2020-06-08 MED ORDER — ENOXAPARIN SODIUM 40 MG/0.4ML IJ SOSY
40.0000 mg | PREFILLED_SYRINGE | INTRAMUSCULAR | Status: DC
  Administered 2020-06-08: 21:00:00 40 mg via SUBCUTANEOUS
  Filled 2020-06-08: qty 0.4

## 2020-06-08 MED ORDER — GADOBUTROL 1 MMOL/ML IV SOLN
7.5000 mL | Freq: Once | INTRAVENOUS | Status: AC | PRN
  Administered 2020-06-08: 7.5 mL via INTRAVENOUS

## 2020-06-08 MED ORDER — ASPIRIN EC 81 MG PO TBEC
81.0000 mg | DELAYED_RELEASE_TABLET | Freq: Every day | ORAL | Status: DC
  Administered 2020-06-09: 09:00:00 81 mg via ORAL
  Filled 2020-06-08: qty 1

## 2020-06-08 MED ORDER — SENNOSIDES-DOCUSATE SODIUM 8.6-50 MG PO TABS
1.0000 | ORAL_TABLET | Freq: Every evening | ORAL | Status: DC | PRN
  Administered 2020-06-09: 09:00:00 1 via ORAL
  Filled 2020-06-08: qty 1

## 2020-06-08 MED ORDER — ACETAMINOPHEN 325 MG PO TABS: 650.0000 mg | ORAL_TABLET | ORAL | Status: DC | PRN

## 2020-06-08 MED ORDER — ACETAMINOPHEN 650 MG RE SUPP: 650.0000 mg | RECTAL | Status: DC | PRN

## 2020-06-08 MED ORDER — ASPIRIN 325 MG PO TABS
325.0000 mg | ORAL_TABLET | Freq: Once | ORAL | Status: AC
  Administered 2020-06-08: 325 mg via ORAL
  Filled 2020-06-08: qty 1

## 2020-06-08 MED ORDER — ONDANSETRON HCL 4 MG/2ML IJ SOLN: 4.0000 mg | Freq: Three times a day (TID) | INTRAMUSCULAR | Status: DC | PRN

## 2020-06-08 MED ORDER — SODIUM CHLORIDE 0.9% FLUSH: 3.0000 mL | Freq: Once | INTRAVENOUS | Status: DC

## 2020-06-08 NOTE — Consult Note (Signed)
NEUROLOGY CONSULTATION NOTE   Date of service: Jun 08, 2020 Patient Name: Briana Swanson MRN:  [id number] DOB:  11/08/1955 Reason for consult: TIA  _ _ _   _ __   _ __ _ _  __ __   _ __   __ _  History of Present Illness   This is a patient with no known pmhx who takes no medications who presented after 2 transient episodes of L face and arm weakness and numbness accompanied by slurred speech today. Both episodes were exactly the same. The first occurred at 0900 and resolved after a few minutes, then recurred at 0930, then again completely resolved. She is completely asymptomatic at this time.   Data thus far:  [facility] NAICP  MRI head:No acute finding. Mild chronic small-vessel change of the pons and cerebral hemispheric white matter.  MRA head: No large or medium vessel occlusion. 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  MRA neck:No sign of dissection. The vessels are tortuous, as might be seen with a history of hypertension. No carotid bifurcation stenosis or irregularity. Vertebral artery origins are not well seen due to chest motion.  She does speak Hindi, and daughter assists with interpretation, the patient does not wish for a hospital interpreter at this time she wishes for her daughter's assistance. Hospital interpreter was offered in ED   ROS   Neg except as described in HPI  Past History   History reviewed. No pertinent past medical history. Past Surgical History:  Procedure Laterality Date  . ABDOMINAL HYSTERECTOMY     Family History  Problem Relation Age of Onset  . Stroke Sister   . Heart disease Brother    Social History   Socioeconomic History  . Marital status: Widowed    Spouse name: Not on file  . Number of children: Not on file  . Years of education: Not on file  . Highest education level: Not on file  Occupational History  . Not on file  Tobacco Use  . Smoking status: Never Smoker  . Smokeless tobacco:  Never Used  Substance and Sexual Activity  . Alcohol use: Never  . Drug use: Never  . Sexual activity: Not on file  Other Topics Concern  . Not on file  Social History Narrative  . Not on file   Social Determinants of Health   Financial Resource Strain: Not on file  Food Insecurity: Not on file  Transportation Needs: Not on file  Physical Activity: Not on file  Stress: Not on file  Social Connections: Not on file   No Known Allergies  Medications   No medications prior to admission.     Vitals   Vitals:   06/08/20 1730 06/08/20 1745 06/08/20 1800 06/08/20 1848  BP:   (!) 182/96 (!) 171/93  Pulse: 60 63 (!) 57 (!) 58  Resp: 19 19 19 17   Temp:      TempSrc:      SpO2: 100% 99% 99% 100%  Weight:      Height:         Body mass index is 35.53 kg/m.  Physical Exam   Physical Exam Gen: A&O x4, NAD HEENT: Atraumatic, normocephalic;mucous membranes moist; oropharynx clear, tongue without atrophy or fasciculations. Neck: Supple, trachea midline. Resp: CTAB, no w/r/r CV: RRR, no m/g/r; nml S1 and S2. 2+ symmetric peripheral pulses. Abd: soft/NT/ND; nabs x 4 quad Extrem: Nml bulk; no cyanosis, clubbing, or edema.  Neuro: *MS: A&O  x4. Follows multi-step commands.  *Speech: fluid, nondysarthric, able to name and repeat *CN:    I: Deferred   II,III: PERRLA, VFF by confrontation, optic discs sharp   III,IV,VI: EOMI w/o nystagmus, no ptosis   V: Sensation intact from V1 to V3 to LT   VII: Eyelid closure was full.  Smile symmetric.   VIII: Hearing intact to voice   IX,X: Voice normal, palate elevates symmetrically    XI: SCM/trap 5/5 bilat   XII: Tongue protrudes midline, no atrophy or fasciculations   *Motor:   Normal bulk.  No tremor, rigidity or bradykinesia. No pronator drift.    Strength: Dlt Bic Tri WrE WrF FgS Gr HF KnF KnE PlF DoF    Left 5 5 5 5 5 5 5 5 5 5 5 5     Right 5 5 5 5 5 5 5 5 5 5 5 5     *Sensory: Intact to light touch, pinprick, temperature  vibration throughout. Symmetric. Propioception intact bilat.  No double-simultaneous extinction.  *Coordination:  Finger-to-nose, heel-to-shin, rapid alternating motions were intact. *Reflexes:  2+ and symmetric throughout without clonus; toes down-going bilat *Gait: deferred  NIHSS = 0   Labs   CBC:  Recent Labs  Lab 06/08/20 1146  WBC 6.0  NEUTROABS 3.7  HGB 12.7  HCT 39.8  MCV 78.7*  PLT 235    Basic Metabolic Panel:  Lab Results  Component Value Date   NA 137 06/08/2020   K 4.0 06/08/2020   CO2 25 06/08/2020   GLUCOSE 140 (H) 06/08/2020   BUN 17 06/08/2020   CREATININE 0.80 06/08/2020   CALCIUM 9.1 06/08/2020   GFRNONAA >60 06/08/2020   Lipid Panel: No results found for: LDLCALC HgbA1c: No results found for: HGBA1C Urine Drug Screen: No results found for: LABOPIA, COCAINSCRNUR, LABBENZ, AMPHETMU, THCU, LABBARB  Alcohol Level No results found for: ETH    Impression   65 yo admitted for TIA w/u after recurrent episodes of L face and arm numbness and weakness with dysarthria, both resolved.  Recommendations   - Admit to hospitalist service for TIA w/u; stroke team will consult - No indication for permissive HTN given stroke ruled out by MRI. Goal normotension, strict avoidance of hypotension.  - TTE w/ bubble - Check A1c and LDL + add statin per guidelines - ASA 325mg  now f/b 81mg  daily after that - q4 hr neuro checks - STAT head CT for any change in neuro exam - Tele - PT/OT/SLP if indicated - Stroke education - Amb referral to neurology upon discharge to include amb cardiac monitoring  Will continue to follow  ______________________________________________________________________   Thank you for the opportunity to take part in the care of this patient. If you have any further questions, please contact the neurology consultation attending.  Signed,  06/10/2020, MD [facility] [phone]  If 7pm- 7am, please page neurology on  call as listed in AMION.

## 2020-06-08 NOTE — ED Notes (Signed)
Transport at bedside  

## 2020-06-08 NOTE — ED Notes (Signed)
Transport requested

## 2020-06-08 NOTE — ED Notes (Signed)
Message sent to floor nurse  

## 2020-06-08 NOTE — H&P (Signed)
History and Physical    Briana Swanson WUJ:[id number]:[id number] DOB: 1955-10-26 DOA: 06/08/2020  Referring MD/NP/PA:   PCP: Pcp, No   Patient coming from:  The patient is coming from home.  At baseline, pt is independent for most of ADL.        Chief Complaint: Slurred speech, left-sided weakness  HPI: Briana Swanson is a 65 y.o. female without a significant past medical history, not taking any medications currently, who presents with slurred speech, left-sided weakness.  Per patient's daughter at bedside, patient had 2 episode of symptoms.  At about 9 AM, patient developed slurred speech and left arm weakness and numbness.  Patient also had left facial numbness.  Symptoms resolved spontaneously, but 30 minutes later she had second similar episode, which lasted for less than 30 minutes, then resolved.  No weakness in left leg.  No vision change or hearing loss.  Patient does not have chest pain, shortness of breath, cough, fever or chills.  No nausea vomiting, diarrhea or abdominal pain.  No symptoms of UTI.  ED Course: pt was found to have WBC 6.0, INR 1.0, PTT 28, pending COVID PCR, electrolytes renal function okay, temperature normal, blood pressure 158/78, heart rate 74, RR 18, oxygen saturation 100% on room air.  CT of head is negative for acute intracranial abnormalities.  Patient was placed on MedSurg bed for observation.  Patient Dr. Selina CooleyStack of neurology is consulted.   MRI of brain and MRA of head and neck:  MRI head: No acute finding. Mild chronic small-vessel change of the pons and cerebral hemispheric white matter.  MRA head: No large or medium vessel occlusion. 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  MRA neck: No sign of dissection. The vessels are tortuous, as might be seen with a history of hypertension. No carotid bifurcation stenosis or irregularity. Vertebral artery origins are not well seen due to chest motion.   Review of Systems:    General: no fevers, chills, no body weight gain,  has fatigue HEENT: no blurry vision, hearing changes or sore throat Respiratory: no dyspnea, coughing, wheezing CV: no chest pain, no palpitations GI: no nausea, vomiting, abdominal pain, diarrhea, constipation GU: no dysuria, burning on urination, increased urinary frequency, hematuria  Ext: no leg edema Neuro: no vision change or hearing loss.  Has slurred speech, left arm weakness and numbness.  Left facial numbness. Skin: no rash, no skin tear. MSK: No muscle spasm, no deformity, no limitation of range of movement in spin Heme: No easy bruising.  Travel history: No recent long distant travel.  Allergy: No Known Allergies  No past medical history on file.  Past Surgical History:  Procedure Laterality Date  . ABDOMINAL HYSTERECTOMY      Social History:  reports that she has never smoked. She has never used smokeless tobacco. She reports that she does not drink alcohol and does not use drugs.  Family History:  Family History  Problem Relation Age of Onset  . Stroke Sister   . Heart disease Brother      Prior to Admission medications   Not on File    Physical Exam: Vitals:   06/08/20 1715 06/08/20 1730 06/08/20 1745 06/08/20 1800  BP:    (!) 182/96  Pulse: 63 60 63 (!) 57  Resp: (!) 21 19 19 19   Temp:      TempSrc:      SpO2: 99% 100% 99% 99%  Weight:  Height:       General: Not in acute distress HEENT:       Eyes: PERRL, EOMI, no scleral icterus.       ENT: No discharge from the ears and nose, no pharynx injection, no tonsillar enlargement.        Neck: No JVD, no bruit, no mass felt. Heme: No neck lymph node enlargement. Cardiac: S1/S2, RRR, No murmurs, No gallops or rubs. Respiratory: No rales, wheezing, rhonchi or rubs. GI: Soft, nondistended, nontender, no rebound pain, no organomegaly, BS present. GU: No hematuria Ext: No pitting leg edema bilaterally. 1+DP/PT pulse bilaterally. Musculoskeletal:  No joint deformities, No joint redness or warmth, no limitation of ROM in spin. Skin: No rashes.  Neuro: Alert, oriented X3, cranial nerves II-XII grossly intact, moves all extremities normally. Muscle strength 5/5 in all extremities, sensation to light touch intact. Brachial reflex 2+ bilaterally. Knee reflex 1+ bilaterally. Negative Babinski's sign. Normal finger to nose test. Psych: Patient is not psychotic, no suicidal or hemocidal ideation.  Labs on Admission: I have personally reviewed following labs and imaging studies  CBC: Recent Labs  Lab 06/08/20 1146  WBC 6.0  NEUTROABS 3.7  HGB 12.7  HCT 39.8  MCV 78.7*  PLT 235   Basic Metabolic Panel: Recent Labs  Lab 06/08/20 1146  NA 137  K 4.0  CL 103  CO2 25  GLUCOSE 140*  BUN 17  CREATININE 0.80  CALCIUM 9.1   GFR: Estimated Creatinine Clearance: 61.3 mL/min (by C-G formula based on SCr of 0.8 mg/dL). Liver Function Tests: Recent Labs  Lab 06/08/20 1146  AST 33  ALT 40  ALKPHOS 53  BILITOT 0.4  PROT 7.5  ALBUMIN 3.9   No results for input(s): LIPASE, AMYLASE in the last 168 hours. No results for input(s): AMMONIA in the last 168 hours. Coagulation Profile: Recent Labs  Lab 06/08/20 1146  INR 1.0   Cardiac Enzymes: No results for input(s): CKTOTAL, CKMB, CKMBINDEX, TROPONINI in the last 168 hours. BNP (last 3 results) No results for input(s): PROBNP in the last 8760 hours. HbA1C: No results for input(s): HGBA1C in the last 72 hours. CBG: No results for input(s): GLUCAP in the last 168 hours. Lipid Profile: No results for input(s): CHOL, HDL, LDLCALC, TRIG, CHOLHDL, LDLDIRECT in the last 72 hours. Thyroid Function Tests: No results for input(s): TSH, T4TOTAL, FREET4, T3FREE, THYROIDAB in the last 72 hours. Anemia Panel: No results for input(s): VITAMINB12, FOLATE, FERRITIN, TIBC, IRON, RETICCTPCT in the last 72 hours. Urine analysis: No results found for: COLORURINE, APPEARANCEUR, LABSPEC, PHURINE,  GLUCOSEU, HGBUR, BILIRUBINUR, KETONESUR, PROTEINUR, UROBILINOGEN, NITRITE, LEUKOCYTESUR Sepsis Labs: (procalcitonin:4,lacticidven:4) ) Recent Results (from the past 240 hour(s))  Resp Panel by RT-PCR (Flu A&B, Covid) Nasopharyngeal Swab     Status: None   Collection Time: 06/08/20  2:24 PM   Specimen: Nasopharyngeal Swab; Nasopharyngeal(NP) swabs in vial transport medium  Result Value Ref Range Status   SARS Coronavirus 2 by RT PCR NEGATIVE NEGATIVE Final    Comment: (NOTE) SARS-CoV-2 target nucleic acids are NOT DETECTED.  The SARS-CoV-2 RNA is generally detectable in upper respiratory specimens during the acute phase of infection. The lowest concentration of SARS-CoV-2 viral copies this assay can detect is 138 copies/mL. A negative result does not preclude SARS-Cov-2 infection and should not be used as the sole basis for treatment or other patient management decisions. A negative result may occur with  improper specimen collection/handling, submission of specimen other than nasopharyngeal swab, presence of viral  mutation(s) within the areas targeted by this assay, and inadequate number of viral copies(<138 copies/mL). A negative result must be combined with clinical observations, patient history, and epidemiological information. The expected result is Negative.  Fact Sheet for Patients:  BloggerCourse.com  Fact Sheet for Healthcare Providers:  SeriousBroker.it  This test is no t yet approved or cleared by the [place] FDA and  has been authorized for detection and/or diagnosis of SARS-CoV-2 by FDA under an Emergency Use Authorization (EUA). This EUA will remain  in effect (meaning this test can be used) for the duration of the COVID-19 declaration under Section 564(b)(1) of the Act, 21 U.S.C.section 360bbb-3(b)(1), unless the authorization is terminated  or revoked sooner.       Influenza A by PCR NEGATIVE  NEGATIVE Final   Influenza B by PCR NEGATIVE NEGATIVE Final    Comment: (NOTE) The Xpert Xpress SARS-CoV-2/FLU/RSV plus assay is intended as an aid in the diagnosis of influenza from Nasopharyngeal swab specimens and should not be used as a sole basis for treatment. Nasal washings and aspirates are unacceptable for Xpert Xpress SARS-CoV-2/FLU/RSV testing.  Fact Sheet for Patients: BloggerCourse.com  Fact Sheet for Healthcare Providers: SeriousBroker.it  This test is not yet approved or cleared by the [place] FDA and has been authorized for detection and/or diagnosis of SARS-CoV-2 by FDA under an Emergency Use Authorization (EUA). This EUA will remain in effect (meaning this test can be used) for the duration of the COVID-19 declaration under Section 564(b)(1) of the Act, 21 U.S.C. section 360bbb-3(b)(1), unless the authorization is terminated or revoked.  Performed at [facility], [address] [place], [place] [address]      Radiological Exams on Admission: CT HEAD WO CONTRAST  Result Date: 06/08/2020 CLINICAL DATA:  TIA symptoms, headache EXAM: CT HEAD WITHOUT CONTRAST TECHNIQUE: Contiguous axial images were obtained from the base of the skull through the vertex without intravenous contrast. COMPARISON:  None. FINDINGS: Brain: No evidence of acute infarction, hemorrhage, hydrocephalus, extra-axial collection or mass lesion/mass effect. Vascular: No hyperdense vessel or unexpected calcification. Skull: Normal. Negative for fracture or focal lesion. Sinuses/Orbits: No acute finding. Other: None. IMPRESSION: Normal head CT without contrast for age. Electronically Signed   By: Judie Petit.  Shick M.D.   On: 06/08/2020 12:57   MR ANGIO HEAD WO CONTRAST  Result Date: 06/08/2020 CLINICAL DATA:  Left-sided weakness, headache and speech disturbance. EXAM: MRI HEAD WITHOUT CONTRAST MRA HEAD WITHOUT CONTRAST MRA NECK WITHOUT AND WITH  CONTRAST TECHNIQUE: Multiplanar, multiecho pulse sequences of the brain and surrounding structures were obtained without and with intravenous contrast. Angiographic images of the Circle of Willis were obtained using MRA technique without intravenous contrast. Angiographic images of the neck were obtained using MRA technique without and with intravenous contrast. Carotid stenosis measurements (when applicable) are obtained utilizing NASCET criteria, using the distal internal carotid diameter as the denominator. CONTRAST:  7.39mL GADAVIST GADOBUTROL 1 MMOL/ML IV SOLN COMPARISON:  Head CT earlier same day FINDINGS: MRI HEAD FINDINGS Brain: Diffusion imaging does not show any acute or subacute infarction. There are mild chronic small-vessel ischemic changes of the pons. Very few foci of T2 and FLAIR signal within the hemispheric white matter consistent with minimal small vessel change. No cortical or large vessel territory infarction. No mass lesion, hemorrhage, hydrocephalus or extra-axial collection. Vascular: Major vessels at the base of the brain show flow. Skull and upper cervical spine: Negative Sinuses/Orbits: Paranasal sinuses are clear.  Orbits appear normal. Other: Left mastoid effusion.  MRA HEAD FINDINGS Both internal carotid arteries widely patent through the skull base and siphon regions. There is either an infundibulum or a 2 mm aneurysm at the left posterior communicating artery origin. Anterior and middle cerebral vessels appear normal. Both vertebral arteries widely patent to the basilar. No basilar stenosis. Posterior circulation branch vessels are patent. Some irregularity of the more distal PCA branches. MRA NECK FINDINGS Branching pattern is normal. No origin stenosis. Both common carotid arteries widely patent to the bifurcation. The vessels are tortuous as might be seen with hypertension. No carotid bifurcation stenosis or irregularity. Cervical internal carotid arteries are tortuous but widely  patent. No sign of dissection. Both vertebral artery origins are poorly seen because of chest motion. Beyond the origins, the vessels are widely patent through the cervical region to the foramen magnum. The vessels are somewhat tortuous but there is no sign of vertebral dissection. IMPRESSION: MRI head: No acute finding. Mild chronic small-vessel change of the pons and cerebral hemispheric white matter. MRA head: No large or medium vessel occlusion. 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 MRA neck: No sign of dissection. The vessels are tortuous, as might be seen with a history of hypertension. No carotid bifurcation stenosis or irregularity. Vertebral artery origins are not well seen due to chest motion. Electronically Signed   By: Paulina Fusi M.D.   On: 06/08/2020 14:41   MR ANGIO NECK W WO CONTRAST  Result Date: 06/08/2020 CLINICAL DATA:  Left-sided weakness, headache and speech disturbance. EXAM: MRI HEAD WITHOUT CONTRAST MRA HEAD WITHOUT CONTRAST MRA NECK WITHOUT AND WITH CONTRAST TECHNIQUE: Multiplanar, multiecho pulse sequences of the brain and surrounding structures were obtained without and with intravenous contrast. Angiographic images of the Circle of Willis were obtained using MRA technique without intravenous contrast. Angiographic images of the neck were obtained using MRA technique without and with intravenous contrast. Carotid stenosis measurements (when applicable) are obtained utilizing NASCET criteria, using the distal internal carotid diameter as the denominator. CONTRAST:  7.59mL GADAVIST GADOBUTROL 1 MMOL/ML IV SOLN COMPARISON:  Head CT earlier same day FINDINGS: MRI HEAD FINDINGS Brain: Diffusion imaging does not show any acute or subacute infarction. There are mild chronic small-vessel ischemic changes of the pons. Very few foci of T2 and FLAIR signal within the hemispheric white matter consistent with minimal small vessel change. No cortical or  large vessel territory infarction. No mass lesion, hemorrhage, hydrocephalus or extra-axial collection. Vascular: Major vessels at the base of the brain show flow. Skull and upper cervical spine: Negative Sinuses/Orbits: Paranasal sinuses are clear.  Orbits appear normal. Other: Left mastoid effusion. MRA HEAD FINDINGS Both internal carotid arteries widely patent through the skull base and siphon regions. There is either an infundibulum or a 2 mm aneurysm at the left posterior communicating artery origin. Anterior and middle cerebral vessels appear normal. Both vertebral arteries widely patent to the basilar. No basilar stenosis. Posterior circulation branch vessels are patent. Some irregularity of the more distal PCA branches. MRA NECK FINDINGS Branching pattern is normal. No origin stenosis. Both common carotid arteries widely patent to the bifurcation. The vessels are tortuous as might be seen with hypertension. No carotid bifurcation stenosis or irregularity. Cervical internal carotid arteries are tortuous but widely patent. No sign of dissection. Both vertebral artery origins are poorly seen because of chest motion. Beyond the origins, the vessels are widely patent through the cervical region to the foramen magnum. The vessels are somewhat tortuous but there is no  sign of vertebral dissection. IMPRESSION: MRI head: No acute finding. Mild chronic small-vessel change of the pons and cerebral hemispheric white matter. MRA head: No large or medium vessel occlusion. 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 MRA neck: No sign of dissection. The vessels are tortuous, as might be seen with a history of hypertension. No carotid bifurcation stenosis or irregularity. Vertebral artery origins are not well seen due to chest motion. Electronically Signed   By: Paulina Fusi M.D.   On: 06/08/2020 14:41   MR BRAIN WO CONTRAST  Result Date: 06/08/2020 CLINICAL DATA:  Left-sided  weakness, headache and speech disturbance. EXAM: MRI HEAD WITHOUT CONTRAST MRA HEAD WITHOUT CONTRAST MRA NECK WITHOUT AND WITH CONTRAST TECHNIQUE: Multiplanar, multiecho pulse sequences of the brain and surrounding structures were obtained without and with intravenous contrast. Angiographic images of the Circle of Willis were obtained using MRA technique without intravenous contrast. Angiographic images of the neck were obtained using MRA technique without and with intravenous contrast. Carotid stenosis measurements (when applicable) are obtained utilizing NASCET criteria, using the distal internal carotid diameter as the denominator. CONTRAST:  7.37mL GADAVIST GADOBUTROL 1 MMOL/ML IV SOLN COMPARISON:  Head CT earlier same day FINDINGS: MRI HEAD FINDINGS Brain: Diffusion imaging does not show any acute or subacute infarction. There are mild chronic small-vessel ischemic changes of the pons. Very few foci of T2 and FLAIR signal within the hemispheric white matter consistent with minimal small vessel change. No cortical or large vessel territory infarction. No mass lesion, hemorrhage, hydrocephalus or extra-axial collection. Vascular: Major vessels at the base of the brain show flow. Skull and upper cervical spine: Negative Sinuses/Orbits: Paranasal sinuses are clear.  Orbits appear normal. Other: Left mastoid effusion. MRA HEAD FINDINGS Both internal carotid arteries widely patent through the skull base and siphon regions. There is either an infundibulum or a 2 mm aneurysm at the left posterior communicating artery origin. Anterior and middle cerebral vessels appear normal. Both vertebral arteries widely patent to the basilar. No basilar stenosis. Posterior circulation branch vessels are patent. Some irregularity of the more distal PCA branches. MRA NECK FINDINGS Branching pattern is normal. No origin stenosis. Both common carotid arteries widely patent to the bifurcation. The vessels are tortuous as might be seen  with hypertension. No carotid bifurcation stenosis or irregularity. Cervical internal carotid arteries are tortuous but widely patent. No sign of dissection. Both vertebral artery origins are poorly seen because of chest motion. Beyond the origins, the vessels are widely patent through the cervical region to the foramen magnum. The vessels are somewhat tortuous but there is no sign of vertebral dissection. IMPRESSION: MRI head: No acute finding. Mild chronic small-vessel change of the pons and cerebral hemispheric white matter. MRA head: No large or medium vessel occlusion. 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 MRA neck: No sign of dissection. The vessels are tortuous, as might be seen with a history of hypertension. No carotid bifurcation stenosis or irregularity. Vertebral artery origins are not well seen due to chest motion. Electronically Signed   By: Paulina Fusi M.D.   On: 06/08/2020 14:41     EKG: I have personally reviewed.  Sinus rhythm, QTC 448, low voltage, nonspecific T wave change  Assessment/Plan Principal Problem:   TIA (transient ischemic attack)   TIA (transient ischemic attack): Patient's symptoms are likely due to TIA.  MRI of brain is negative for stroke.  MRA of head and neck is negative  for LVO.  Dr. Selina Cooley of neurology is consulted.  -Placed on MedSurg bed for observation - will follow up Neurology's Recs.   - start ASA and lipitor  - fasting lipid panel and HbA1c  - 2D transthoracic echocardiography  - swallowing screen. If fails, will get SLP - Check UDS     DVT ppx: SQ Lovenox Code Status: Full code Family Communication:  Yes, patient's daughter   at bed side Disposition Plan:  Anticipate discharge back to previous environment Consults called: Dr. Selina Cooley of neurology Admission status and Level of care: Med-Surg:   for obs   Status is: Observation  The patient remains OBS appropriate and will d/c before 2  midnights.  Dispo: The patient is from: Home              Anticipated d/c is to: Home              Patient currently is not medically stable to d/c.   Difficult to place patient No          Date of Service 06/08/2020    Lorretta Harp [facility]   If 7PM-7AM, please contact night-coverage [email or web address] 06/08/2020, 6:27 PM

## 2020-06-08 NOTE — ED Notes (Signed)
pt sates that she has burning behind the eyes and its hard for to open eyes to light; pt stated she is not having weakness atm however daughter said she thinks another episode will happen

## 2020-06-08 NOTE — ED Notes (Signed)
Russell RN aware of assigned bed 

## 2020-06-08 NOTE — ED Provider Notes (Signed)
[facility] Emergency Department Provider Note   ____________________________________________   Event Date/Time   First MD Initiated Contact with Patient 06/08/20 1236     (approximate)  I have reviewed the triage vital signs and the nursing notes.   HISTORY  Chief Complaint Weakness    HPI Dannae Kato is a 65 y.o. female reports no major past medical history  She does speak Hindi, and daughter assists with interpretation, the patient does not wish for a hospital interpreter at this time she wishes for her daughter's assistance  She was offered a hospital interpreter/telemetry interpreter  Patient had about 9 AM this morning was noticed to have left-sided weakness followed by a mild headache.  Also her daughter reports her speech was not clear at that time.  The symptoms went away and the patient did not want to go to the hospital, but then later symptoms recurred.  Her symptoms have again since gone away and she feels much better now.  Not having any ongoing symptoms weakness or headache  She has no known allergies.  She takes no new medications takes no medications at all.  Denies significant medical history   No past medical history on file.  There are no problems to display for this patient.   Past Surgical History:  Procedure Laterality Date  . ABDOMINAL HYSTERECTOMY      Prior to Admission medications   Not on File    Allergies Patient has no known allergies.  No family history on file.  Social History    Review of Systems Constitutional: No fever/chills or recent illness Eyes: No visual changes. ENT: No neck pain Cardiovascular: Denies chest pain. Respiratory: Denies shortness of breath. Gastrointestinal: No abdominal pain.   Musculoskeletal: Negative for back pain.  During one of the episode she did feel like a crampy or muscular pain on the left shoulder which has resolved. Skin: Negative for rash. Neurological: See  HPI    ____________________________________________   PHYSICAL EXAM:  VITAL SIGNS: ED Triage Vitals  Enc Vitals Group     BP 06/08/20 1157 (!) 162/76     Pulse Rate 06/08/20 1157 75     Resp 06/08/20 1157 17     Temp 06/08/20 1157 97.7 F (36.5 C)     Temp Source 06/08/20 1157 Oral     SpO2 06/08/20 1157 97 %     Weight 06/08/20 1204 170 lb (77.1 kg)     Height 06/08/20 1204 4\' 10"  (1.473 m)     Head Circumference --      Peak Flow --      Pain Score 06/08/20 1204 0     Pain Loc --      Pain Edu? --      Excl. in GC? --     Constitutional: Alert and oriented. Well appearing and in no acute distress. Eyes: Conjunctivae are normal. Head: Atraumatic. Nose: No congestion/rhinnorhea. Mouth/Throat: Mucous membranes are moist. Neck: No stridor.  Cardiovascular: Normal rate, regular rhythm. Grossly normal heart sounds.  Good peripheral circulation. Respiratory: Normal respiratory effort.  No retractions. Lungs CTAB. Gastrointestinal: Soft and nontender. No distention. Musculoskeletal: No lower extremity tenderness nor edema. Neurologic:  Normal speech and language. No gross focal neurologic deficits are appreciated. Full NIHSS completed by me at 1245pm and NIH is ZERO at this time. VAN negative.  Speech is clear orientation normal, this is in accord with her daughter.  Daughter does however report at 69 AM her speech was not  clear Skin:  Skin is warm, dry and intact. No rash noted. Psychiatric: Mood and affect are normal. Speech and behavior are normal.  ____________________________________________   LABS (all labs ordered are listed, but only abnormal results are displayed)  Labs Reviewed  CBC - Abnormal; Notable for the following components:      Result Value   MCV 78.7 (*)    MCH 25.1 (*)    All other components within normal limits  COMPREHENSIVE METABOLIC PANEL - Abnormal; Notable for the following components:   Glucose, Bld 140 (*)    All other components within  normal limits  RESP PANEL BY RT-PCR (FLU A&B, COVID) [id number]  PROTIME-INR  APTT  DIFFERENTIAL  LDL CHOLESTEROL, DIRECT  HEMOGLOBIN A1C  CBG MONITORING, ED   ____________________________________________  EKG  ED ECG REPORT I, Sharyn Creamer, the attending physician, personally viewed and interpreted this ECG.  Date: 06/08/2020 EKG Time: 1205 Rate: 80 Rhythm: normal sinus rhythm QRS Axis: normal Intervals: normal ST/T Wave abnormalities: normal Narrative Interpretation: no evidence of acute ischemia  ____________________________________________  RADIOLOGY  CT HEAD WO CONTRAST  Result Date: 06/08/2020 CLINICAL DATA:  TIA symptoms, headache EXAM: CT HEAD WITHOUT CONTRAST TECHNIQUE: Contiguous axial images were obtained from the base of the skull through the vertex without intravenous contrast. COMPARISON:  None. FINDINGS: Brain: No evidence of acute infarction, hemorrhage, hydrocephalus, extra-axial collection or mass lesion/mass effect. Vascular: No hyperdense vessel or unexpected calcification. Skull: Normal. Negative for fracture or focal lesion. Sinuses/Orbits: No acute finding. Other: None. IMPRESSION: Normal head CT without contrast for age. Electronically Signed   By: Judie Petit.  Shick M.D.   On: 06/08/2020 12:57    CT head negative for acute ____________________________________________   PROCEDURES  Procedure(s) performed: None  Procedures  Critical Care performed: Yes, see critical care note(s)  CRITICAL CARE Performed by: Sharyn Creamer   Total critical care time: 25 minutes  Critical care time was exclusive of separately billable procedures and treating other patients.  Critical care was necessary to treat or prevent imminent or life-threatening deterioration.  Critical care was time spent personally by me on the following activities: development of treatment plan with patient and/or surrogate as well as nursing, discussions with consultants, evaluation of patient's  response to treatment, examination of patient, obtaining history from patient or surrogate, ordering and performing treatments and interventions, ordering and review of laboratory studies, ordering and review of radiographic studies, pulse oximetry and re-evaluation of patient's condition.  Patient seen and evaluated in the context of an acute onset of neurologic deficits including left-sided arm and leg weakness associated also with speech changes that have now since resolved.  Symptoms first occurred at 9 AM, waxing and waning, but now completely resolved.  She did also associate a headache with this, but CT head is negative for acute hemorrhage no subarachnoid hemorrhage etc.  Given her presentation and timeframe, CT head without contrast is felt to be highly sensitive for excluding intracranial hemorrhage  Patient seen and evaluated, I discussed the case with Dr. Selina Cooley via [facility] and she advises admission to the hospital and his ordered stroke evaluation work-up and aspirin at this time. ____________________________________________   INITIAL IMPRESSION / ASSESSMENT AND PLAN / ED COURSE  Pertinent labs & imaging results that were available during my care of the patient were reviewed by me and considered in my medical decision making (see chart for details).   No associated acute cardiac pulmonary or obvious vascular symptoms.  As she had symptoms that are very  concerning for an acute focal neurologic deficit that is now since resolved.  There was some association with a headache, but this is no longer present.  Complex migraine would also remain on the differential, but this seems somewhat unusual in a 65 year old female with no previous history of same.  Will admit for further care and work-up.  At this point my working impression is likely TIA but obviously further work-up is pending.  Both the patient as well as her daughter understand plan for admission further work-up and discussion with  neurologist ----------------------------------------- 1:11 PM on 06/08/2020 -----------------------------------------  Labs reviewed, unremarkable as regard to acute presentation   Utilized Hindi telemetry interpreter to discuss with patient plan of care, results, and she reports all symptoms resolved at present time.  Patient and daughter both understanding with need for admission further work-up and neurology consultation to occur with Dr. Selina Cooley  ____________________________________________   FINAL CLINICAL IMPRESSION(S) / ED DIAGNOSES  Final diagnoses:  TIA (transient ischemic attack)        Note:  This document was prepared using Dragon voice recognition software and may include unintentional dictation errors       Sharyn Creamer, MD 06/08/20 1312

## 2020-06-08 NOTE — ED Triage Notes (Signed)
C/O episode of being unable to speak and move left side of body this morning at 0900.  Symptoms resolved and has occurred 2 other times since.  Currently patient denies complaint.  Daughter states symptoms are proceeded by headache.  Currently AAOx3.  Skin warm and dry.  MAE equally and strong.  Speech clear. Facial movements equal.

## 2020-06-08 NOTE — ED Notes (Signed)
Family at bedside. 

## 2020-06-09 ENCOUNTER — Observation Stay (HOSPITAL_BASED_OUTPATIENT_CLINIC_OR_DEPARTMENT_OTHER)
Admit: 2020-06-09 | Discharge: 2020-06-09 | Disposition: A | Discharge status: Home / Self Care | Payer: Medicaid Other | Attending: Internal Medicine | Admitting: Internal Medicine

## 2020-06-09 DIAGNOSIS — G459 Transient cerebral ischemic attack, unspecified: Secondary | ICD-10-CM

## 2020-06-09 LAB — ECHOCARDIOGRAM COMPLETE BUBBLE STUDY
AR max vel: 2.13 cm2
AV Area VTI: 2.58 cm2
AV Area mean vel: 2.31 cm2
AV Mean grad: 3 mmHg
AV Peak grad: 7 mmHg
Ao pk vel: 1.32 m/s
Area-P 1/2: 2.68 cm2
MV VTI: 2.36 cm2
S' Lateral: 3.1 cm

## 2020-06-09 LAB — URINE DRUG SCREEN, QUALITATIVE (ARMC ONLY)
Amphetamines, Ur Screen: NOT DETECTED
Barbiturates, Ur Screen: NOT DETECTED
Benzodiazepine, Ur Scrn: NOT DETECTED
Cannabinoid 50 Ng, Ur ~~LOC~~: NOT DETECTED
Cocaine Metabolite,Ur ~~LOC~~: NOT DETECTED
MDMA (Ecstasy)Ur Screen: NOT DETECTED
Methadone Scn, Ur: NOT DETECTED
Opiate, Ur Screen: NOT DETECTED
Phencyclidine (PCP) Ur S: NOT DETECTED
Tricyclic, Ur Screen: NOT DETECTED

## 2020-06-09 LAB — HIV ANTIBODY (ROUTINE TESTING W REFLEX): HIV Screen 4th Generation wRfx: NONREACTIVE

## 2020-06-09 LAB — HEMOGLOBIN A1C
Hgb A1c MFr Bld: 6.4 % — ABNORMAL HIGH (ref 4.8–5.6)
Mean Plasma Glucose: 137 mg/dL

## 2020-06-09 MED ORDER — AMLODIPINE BESYLATE 5 MG PO TABS
5.0000 mg | ORAL_TABLET | Freq: Every day | ORAL | Status: DC
  Administered 2020-06-09: 5 mg via ORAL
  Filled 2020-06-09: qty 1

## 2020-06-09 MED ORDER — ATORVASTATIN CALCIUM 80 MG PO TABS: 80.0000 mg | ORAL_TABLET | Freq: Every day | ORAL | 2 refills | Status: DC

## 2020-06-09 MED ORDER — AMLODIPINE BESYLATE 5 MG PO TABS: 5.0000 mg | ORAL_TABLET | Freq: Every day | ORAL | 1 refills | Status: DC

## 2020-06-09 MED ORDER — ENOXAPARIN SODIUM 40 MG/0.4ML IJ SOSY: 40.0000 mg | PREFILLED_SYRINGE | INTRAMUSCULAR | Status: DC

## 2020-06-09 MED ORDER — ASPIRIN 81 MG PO TBEC: 81.0000 mg | DELAYED_RELEASE_TABLET | Freq: Every day | ORAL | 11 refills | Status: DC

## 2020-06-09 NOTE — Evaluation (Signed)
Occupational Therapy Evaluation Patient Details Name: Briana Swanson MRN: [id number] DOB: October 19, 1955 Today's Date: 06/09/2020    History of Present Illness Pt is 65 y/o F with no significant past medical history. Pt presented to ED after 2 transient episodes of L face and arm weakness and numbness accompanied by slurred speech today. W/u includes: MRI head: No acute finding. 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 Pt presents asymptomatic this date.   Clinical Impression   Pt seen for OT evaluation this date in setting of acute hospitalization d/t numbness and slurred speech. Pt presents this date with s/s seemingly resolved and is in the bathroom when OT presents to room. Pt and daughter defer use of interpreter and her daughter assists with communication throughout session. Pt reports being INDEP at baseline including HH IADLs and negotiating steps to second floor bedroom with no AD. Pt does not drive, but her daughter and SIL coordinate providing transportation for her as needed. Balance assessed and pt with normal static balance and good dynamic balance both in sitting and standing with no AD. Pt with coordination, strength, ROM and sensation equal and functional bilaterally. OT educates pt and dtr re: stroke s/s, prevention including monitoring BP to preserve pt's ADL independence. In addition, pt and dtr asking about [facility] exercises and OT demonstrates and pt able to deliver return-demo indicating 100% carryover. No further acute OT indicated at this time, nor does it appear that pt will require any f/u from OT standpoint. Will sign off at this time.     Follow Up Recommendations  No OT follow up    Equipment Recommendations  None recommended by OT    Recommendations for Other Services       Precautions / Restrictions  Restrictions Weight Bearing Restrictions: No      Mobility Bed Mobility Overal bed mobility: Independent                  Transfers Overall transfer level: Independent                    Balance Overall balance assessment: Independent                                         ADL either performed or assessed with clinical judgement   ADL Overall ADL's : Independent                                       General ADL Comments: standing ADLs sink-side with no assistance.     Vision Patient Visual Report: No change from baseline Vision Assessment?: Yes Eye Alignment: Within Functional Limits Ocular Range of Motion: Within Functional Limits Alignment/Gaze Preference: Within Defined Limits Tracking/Visual Pursuits: Able to track stimulus in all quads without difficulty Saccades: Within functional limits Convergence: Within functional limits     Perception     Praxis      Pertinent Vitals/Pain Pain Assessment: No/denies pain     Hand Dominance     Extremity/Trunk Assessment Upper Extremity Assessment Upper Extremity Assessment: Overall WFL for tasks assessed (coordination, strength and sensation assessed and equal bilaterally. Functional for age.)   Lower Extremity Assessment  Lower Extremity Assessment: Overall WFL for tasks assessed   Cervical / Trunk Assessment Cervical / Trunk Assessment: Normal   Communication Communication Communication: No difficulties;Prefers language other than English (urdu speaker. Pt and daughter defer use of interpreter, pt's daughter assists with communication throughout session.)   Cognition Arousal/Alertness: Awake/alert Behavior During Therapy: WFL for tasks assessed/performed Overall Cognitive Status: Within Functional Limits for tasks assessed                                 General Comments: Pt and dtr defer use of interpreter. Pt appropriate, oriented and able to  follow all commands.   General Comments  Normal static standing and sitting balance. Pt able to accept challenge with G dynamic standing balance with no AD.    Exercises Other Exercises Other Exercises: OT educates pt and dtr re: [facility] exercises to perform to help maintain strength and coordination. Both parties reception. OT provides demonstration and pt performs return demo appropriately.   Shoulder Instructions      Home Living Family/patient expects to be discharged to:: Private residence Living Arrangements: Children (dtr and SIL) Available Help at Discharge: Family;Available PRN/intermittently Type of Home: House Home Access: Stairs to enter     Home Layout: Two level;Bed/bath upstairs Alternate Level Stairs-Number of Steps: 13 Alternate Level Stairs-Rails: Right;Left;Can reach both           Home Equipment: None          Prior Functioning/Environment Level of Independence: Independent        Comments: Pt able to perform all self care and contributes to [facility] IADLs. No equipment for home or community amb. Able to access her upstairs BR/BA with no difficulties. Pt does not drive, her daughter provides transportation. Pt with no falls hx except for fall just prior to coming to hospital.        OT Problem List: Decreased coordination      OT Treatment/Interventions: Therapeutic activities;Therapeutic exercise;Neuromuscular education    OT Goals(Current goals can be found in the care plan section) Acute Rehab OT Goals Patient Stated Goal: to see the neurologist and go home OT Goal Formulation: All assessment and education complete, DC therapy  OT Frequency:     Barriers to D/C:            Co-evaluation              AM-PAC OT "6 Clicks" Daily Activity     Outcome Measure Help from another person eating meals?: None Help from another person taking care of personal grooming?: None Help from another person toileting, which includes using toliet, bedpan, or  urinal?: None Help from another person bathing (including washing, rinsing, drying)?: None Help from another person to put on and taking off regular upper body clothing?: None Help from another person to put on and taking off regular lower body clothing?: None 6 Click Score: 24   End of Session Nurse Communication: Mobility status  Activity Tolerance: Patient tolerated treatment well Patient left: in chair;with call bell/phone within reach;with family/visitor present  OT Visit Diagnosis: Other (comment) (R27.8 other lack of coordination)                Time: 1517-6160 OT Time Calculation (min): 25 min Charges:  OT General Charges $OT Visit: 1 Visit OT Evaluation $OT Eval Low Complexity: 1 Low OT Treatments $Neuromuscular Re-education: 8-22 mins  Rejeana Brock, MS, OTR/L ascom [phone] 06/09/20, 10:26  AM

## 2020-06-09 NOTE — Progress Notes (Signed)
*  PRELIMINARY RESULTS* Echocardiogram 2D Echocardiogram has been performed.  Briana Swanson Briana Swanson 06/09/2020, 11:07 AM

## 2020-06-09 NOTE — TOC Transition Note (Signed)
Transition of Care [facility]) - CM/SW Discharge Note   Patient Details  Name: Briana Swanson MRN: [id number] Date of Birth: [birth year]  Transition of Care [facility]) CM/SW Contact:  Caryn Section, RN Phone Number: 06/09/2020, 1:53 PM   Clinical Narrative:   Patient has no pcp, RNCM called [facility] for an appointment, first appointment is Thurs June 2 at [facility], [place].            Patient Goals and CMS Choice        Discharge Placement                       Discharge Plan and Services                                     Social Determinants of Health (SDOH) Interventions     Readmission Risk Interventions No flowsheet data found.

## 2020-06-09 NOTE — Progress Notes (Signed)
PT Cancellation Note  Patient Details Name: Briana Swanson MRN: [id number] DOB: May 23, 1955   Cancelled Treatment:    Reason Eval/Treat Not Completed: PT screened, no needs identified, will sign off (Consult received and chart reviewed.  Per patient and daughter at bedside (interpreter declined), initial presenting symptoms have fully resolved and patient now back to baseline.  Both defer need for normal PT evaluation at this time.  Report patient has been mobilizing indep in room; no safety concerns reported. OT evaluation was completed this AM; OT verifies above, stating patient mobilizing in room indep; good dynamic balance, good strength and coordination with functional activities.  No immediate PT needs identified. Will complete PT order at this time; please re-consult should needs change.   Ashlyn Cabler H. Manson Passey, PT, DPT, NCS 06/09/20, 10:34 AM [phone]

## 2020-06-09 NOTE — TOC Transition Note (Signed)
Transition of Care [facility]) - CM/SW Discharge Note   Patient Details  Name: Briana Swanson MRN: [id number] Date of Birth: 1955/08/31  Transition of Care [facility]) CM/SW Contact:  Caryn Section, RN Phone Number: 06/09/2020, 1:44 PM   Clinical Narrative:  Patient being discharged today.  Lives at home with daughter and family, no concerns about getting to appointments or getting medications.  Patient uses walker to walk at home and has no current Home Health services.  Family did not identify any other discharge needs at this time           Patient Goals and CMS Choice        Discharge Placement                       Discharge Plan and Services                                     Social Determinants of Health (SDOH) Interventions     Readmission Risk Interventions No flowsheet data found.

## 2020-06-09 NOTE — Progress Notes (Signed)
SLP Cancellation Note  Patient Details Name: Briana Swanson MRN: [id number] DOB: January 13, 1956   Cancelled treatment:       Reason Eval/Treat Not Completed: SLP screened, no needs identified, will sign off (chart reviewed; consulted NSG then met w/ pt/Dtr in room). Pt denied any difficulty swallowing and is currently on a regular diet; tolerates swallowing pills w/ water per NSG. Pt conversed at conversational level w/out deficits per Dtr, pt, and MD. Per pt and DTR (interpreter declined), initial presenting symptoms have fully resolved and patient now back to baseline. No indication for ST evaluation at this time.  No further skilled ST services indicated as pt appears at her baseline. Pt agreed. NSG to reconsult if any change in status.      Orinda Kenner, MS, CCC-SLP Speech Language Pathologist Rehab Services [phone] [facility] 06/09/2020, 10:59 AM

## 2020-06-09 NOTE — Discharge Summary (Signed)
[facility]  Physician Discharge Summary   Patient ID: Briana Swanson AgeeSafia Plaisted MRN: [id number] DOB/AGE: Sep 02, 1955 65 y.o.  Admit date: 06/08/2020 Discharge date: 06/09/2020  PCP: Pcp, No  DISCHARGE DIAGNOSES:  Transient ischemic attack Hyperlipidemia Essential hypertension   RECOMMENDATIONS FOR OUTPATIENT FOLLOW UP: 1. Patient to follow-up with outpatient providers for further management of hypertension 2. Neurology has referred patient to outpatient neurologist    Home Health: None Equipment/Devices: None  CODE STATUS: Full code  DISCHARGE CONDITION: fair  Diet recommendation: Heart healthy  INITIAL HISTORY: 65 y.o. female without a significant past medical history, not taking any medications currently, who presents with slurred speech, left-sided weakness.  Per patient's daughter at bedside, patient had 2 episode of symptoms.  At about 9 AM, patient developed slurred speech and left arm weakness and numbness.  Patient also had left facial numbness.  Symptoms resolved spontaneously, but 30 minutes later she had second similar episode, which lasted for less than 30 minutes, then resolved.  No weakness in left leg.  No vision change or hearing loss.  Patient does not have chest pain, shortness of breath, cough, fever or chills.  No nausea vomiting, diarrhea or abdominal pain.  No symptoms of UTI.  ED Course: pt was found to have WBC 6.0, INR 1.0, PTT 28, pending COVID PCR, electrolytes renal function okay, temperature normal, blood pressure 158/78, heart rate 74, RR 18, oxygen saturation 100% on room air.  CT of head is negative for acute intracranial abnormalities.  Patient was placed on MedSurg bed for observation.  Patient Dr. Selina CooleyStack of neurology is consulted.  Consultations:  Neurology  Procedures:  Transthoracic echocardiogram.  See report below.  HOSPITAL COURSE:   TIA Patient was hospitalized for further evaluation.  Her neurological deficits were transient and  thought to be secondary to TIA.  Patient was seen by neurology.  Patient underwent MRI brain, MRA head and neck.  Echocardiogram was also done.  Nothing concerning was noted on these tests.  Patient seems to be back to baseline.  Has been able to ambulate without difficulty.  She declined PT and OT evaluation.  Patient currently visiting her daughter from out of country.  She has been established with a local clinic for follow-up.  LDL noted to be 152.  She will be discharged on statin medication and aspirin.  Her HbA1c was noted to be 6.4.  She was told that she will need to have this reevaluated in few weeks and if it still elevated she may need to be started on treatment.  In the meantime she should watch her diet and make lifestyle changes.  Blood pressure was also noted to be elevated and the patient has been started on amlodipine.  Grade 1 diastolic dysfunction noted on echocardiogram likely due to untreated hypertension.   Obesity Estimated body mass index is 35.53 kg/m as calculated from the following:   Height as of this encounter: 4\' 10"  (1.473 m).   Weight as of this encounter: 77.1 kg.  Patient is stable and okay for discharge today.  Discussed with the daughter who was able to interpret for the patient.  PERTINENT LABS:  The results of significant diagnostics from this hospitalization (including imaging, microbiology, ancillary and laboratory) are listed below for reference.    Microbiology: Recent Results (from the past 240 hour(s))  Resp Panel by RT-PCR (Flu A&B, Covid) Nasopharyngeal Swab     Status: None   Collection Time: 06/08/20  2:24 PM   Specimen: Nasopharyngeal Swab; Nasopharyngeal(NP)  swabs in vial transport medium  Result Value Ref Range Status   SARS Coronavirus 2 by RT PCR NEGATIVE NEGATIVE Final    Comment: (NOTE) SARS-CoV-2 target nucleic acids are NOT DETECTED.  The SARS-CoV-2 RNA is generally detectable in upper respiratory specimens during the acute phase  of infection. The lowest concentration of SARS-CoV-2 viral copies this assay can detect is 138 copies/mL. A negative result does not preclude SARS-Cov-2 infection and should not be used as the sole basis for treatment or other patient management decisions. A negative result may occur with  improper specimen collection/handling, submission of specimen other than nasopharyngeal swab, presence of viral mutation(s) within the areas targeted by this assay, and inadequate number of viral copies(<138 copies/mL). A negative result must be combined with clinical observations, patient history, and epidemiological information. The expected result is Negative.  Fact Sheet for Patients:  BloggerCourse.com  Fact Sheet for Healthcare Providers:  SeriousBroker.it  This test is no t yet approved or cleared by the [place] FDA and  has been authorized for detection and/or diagnosis of SARS-CoV-2 by FDA under an Emergency Use Authorization (EUA). This EUA will remain  in effect (meaning this test can be used) for the duration of the COVID-19 declaration under Section 564(b)(1) of the Act, 21 U.S.C.section 360bbb-3(b)(1), unless the authorization is terminated  or revoked sooner.       Influenza A by PCR NEGATIVE NEGATIVE Final   Influenza B by PCR NEGATIVE NEGATIVE Final    Comment: (NOTE) The Xpert Xpress SARS-CoV-2/FLU/RSV plus assay is intended as an aid in the diagnosis of influenza from Nasopharyngeal swab specimens and should not be used as a sole basis for treatment. Nasal washings and aspirates are unacceptable for Xpert Xpress SARS-CoV-2/FLU/RSV testing.  Fact Sheet for Patients: BloggerCourse.com  Fact Sheet for Healthcare Providers: SeriousBroker.it  This test is not yet approved or cleared by the [place] FDA and has been authorized for detection and/or diagnosis of  SARS-CoV-2 by FDA under an Emergency Use Authorization (EUA). This EUA will remain in effect (meaning this test can be used) for the duration of the COVID-19 declaration under Section 564(b)(1) of the Act, 21 U.S.C. section 360bbb-3(b)(1), unless the authorization is terminated or revoked.  Performed at [facility], [address] [place], [place] [address]      Labs:  COVID-19 Labs   Lab Results  Component Value Date   SARSCOV2NAA NEGATIVE 06/08/2020      Basic Metabolic Panel: Recent Labs  Lab 06/08/20 1146  NA 137  K 4.0  CL 103  CO2 25  GLUCOSE 140*  BUN 17  CREATININE 0.80  CALCIUM 9.1   Liver Function Tests: Recent Labs  Lab 06/08/20 1146  AST 33  ALT 40  ALKPHOS 53  BILITOT 0.4  PROT 7.5  ALBUMIN 3.9   CBC: Recent Labs  Lab 06/08/20 1146  WBC 6.0  NEUTROABS 3.7  HGB 12.7  HCT 39.8  MCV 78.7*  PLT 235     IMAGING STUDIES CT HEAD WO CONTRAST  Result Date: 06/08/2020 CLINICAL DATA:  TIA symptoms, headache EXAM: CT HEAD WITHOUT CONTRAST TECHNIQUE: Contiguous axial images were obtained from the base of the skull through the vertex without intravenous contrast. COMPARISON:  None. FINDINGS: Brain: No evidence of acute infarction, hemorrhage, hydrocephalus, extra-axial collection or mass lesion/mass effect. Vascular: No hyperdense vessel or unexpected calcification. Skull: Normal. Negative for fracture or focal lesion. Sinuses/Orbits: No acute finding. Other: None. IMPRESSION: Normal head CT without contrast for  age. Electronically Signed   By: Judie Petit.  Shick M.D.   On: 06/08/2020 12:57   MR ANGIO HEAD WO CONTRAST  Result Date: 06/08/2020 CLINICAL DATA:  Left-sided weakness, headache and speech disturbance. EXAM: MRI HEAD WITHOUT CONTRAST MRA HEAD WITHOUT CONTRAST MRA NECK WITHOUT AND WITH CONTRAST TECHNIQUE: Multiplanar, multiecho pulse sequences of the brain and surrounding structures were obtained without and with intravenous contrast.  Angiographic images of the Circle of Willis were obtained using MRA technique without intravenous contrast. Angiographic images of the neck were obtained using MRA technique without and with intravenous contrast. Carotid stenosis measurements (when applicable) are obtained utilizing NASCET criteria, using the distal internal carotid diameter as the denominator. CONTRAST:  7.31mL GADAVIST GADOBUTROL 1 MMOL/ML IV SOLN COMPARISON:  Head CT earlier same day FINDINGS: MRI HEAD FINDINGS Brain: Diffusion imaging does not show any acute or subacute infarction. There are mild chronic small-vessel ischemic changes of the pons. Very few foci of T2 and FLAIR signal within the hemispheric white matter consistent with minimal small vessel change. No cortical or large vessel territory infarction. No mass lesion, hemorrhage, hydrocephalus or extra-axial collection. Vascular: Major vessels at the base of the brain show flow. Skull and upper cervical spine: Negative Sinuses/Orbits: Paranasal sinuses are clear.  Orbits appear normal. Other: Left mastoid effusion. MRA HEAD FINDINGS Both internal carotid arteries widely patent through the skull base and siphon regions. There is either an infundibulum or a 2 mm aneurysm at the left posterior communicating artery origin. Anterior and middle cerebral vessels appear normal. Both vertebral arteries widely patent to the basilar. No basilar stenosis. Posterior circulation branch vessels are patent. Some irregularity of the more distal PCA branches. MRA NECK FINDINGS Branching pattern is normal. No origin stenosis. Both common carotid arteries widely patent to the bifurcation. The vessels are tortuous as might be seen with hypertension. No carotid bifurcation stenosis or irregularity. Cervical internal carotid arteries are tortuous but widely patent. No sign of dissection. Both vertebral artery origins are poorly seen because of chest motion. Beyond the origins, the vessels are widely patent  through the cervical region to the foramen magnum. The vessels are somewhat tortuous but there is no sign of vertebral dissection. IMPRESSION: MRI head: No acute finding. Mild chronic small-vessel change of the pons and cerebral hemispheric white matter. MRA head: No large or medium vessel occlusion. 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 MRA neck: No sign of dissection. The vessels are tortuous, as might be seen with a history of hypertension. No carotid bifurcation stenosis or irregularity. Vertebral artery origins are not well seen due to chest motion. Electronically Signed   By: Paulina Fusi M.D.   On: 06/08/2020 14:41   MR ANGIO NECK W WO CONTRAST  Result Date: 06/08/2020 CLINICAL DATA:  Left-sided weakness, headache and speech disturbance. EXAM: MRI HEAD WITHOUT CONTRAST MRA HEAD WITHOUT CONTRAST MRA NECK WITHOUT AND WITH CONTRAST TECHNIQUE: Multiplanar, multiecho pulse sequences of the brain and surrounding structures were obtained without and with intravenous contrast. Angiographic images of the Circle of Willis were obtained using MRA technique without intravenous contrast. Angiographic images of the neck were obtained using MRA technique without and with intravenous contrast. Carotid stenosis measurements (when applicable) are obtained utilizing NASCET criteria, using the distal internal carotid diameter as the denominator. CONTRAST:  7.72mL GADAVIST GADOBUTROL 1 MMOL/ML IV SOLN COMPARISON:  Head CT earlier same day FINDINGS: MRI HEAD FINDINGS Brain: Diffusion imaging does not show any acute or subacute infarction. There  are mild chronic small-vessel ischemic changes of the pons. Very few foci of T2 and FLAIR signal within the hemispheric white matter consistent with minimal small vessel change. No cortical or large vessel territory infarction. No mass lesion, hemorrhage, hydrocephalus or extra-axial collection. Vascular: Major vessels at the base of the brain  show flow. Skull and upper cervical spine: Negative Sinuses/Orbits: Paranasal sinuses are clear.  Orbits appear normal. Other: Left mastoid effusion. MRA HEAD FINDINGS Both internal carotid arteries widely patent through the skull base and siphon regions. There is either an infundibulum or a 2 mm aneurysm at the left posterior communicating artery origin. Anterior and middle cerebral vessels appear normal. Both vertebral arteries widely patent to the basilar. No basilar stenosis. Posterior circulation branch vessels are patent. Some irregularity of the more distal PCA branches. MRA NECK FINDINGS Branching pattern is normal. No origin stenosis. Both common carotid arteries widely patent to the bifurcation. The vessels are tortuous as might be seen with hypertension. No carotid bifurcation stenosis or irregularity. Cervical internal carotid arteries are tortuous but widely patent. No sign of dissection. Both vertebral artery origins are poorly seen because of chest motion. Beyond the origins, the vessels are widely patent through the cervical region to the foramen magnum. The vessels are somewhat tortuous but there is no sign of vertebral dissection. IMPRESSION: MRI head: No acute finding. Mild chronic small-vessel change of the pons and cerebral hemispheric white matter. MRA head: No large or medium vessel occlusion. 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 MRA neck: No sign of dissection. The vessels are tortuous, as might be seen with a history of hypertension. No carotid bifurcation stenosis or irregularity. Vertebral artery origins are not well seen due to chest motion. Electronically Signed   By: Paulina Fusi M.D.   On: 06/08/2020 14:41   MR BRAIN WO CONTRAST  Result Date: 06/08/2020 CLINICAL DATA:  Left-sided weakness, headache and speech disturbance. EXAM: MRI HEAD WITHOUT CONTRAST MRA HEAD WITHOUT CONTRAST MRA NECK WITHOUT AND WITH CONTRAST TECHNIQUE: Multiplanar,  multiecho pulse sequences of the brain and surrounding structures were obtained without and with intravenous contrast. Angiographic images of the Circle of Willis were obtained using MRA technique without intravenous contrast. Angiographic images of the neck were obtained using MRA technique without and with intravenous contrast. Carotid stenosis measurements (when applicable) are obtained utilizing NASCET criteria, using the distal internal carotid diameter as the denominator. CONTRAST:  7.55mL GADAVIST GADOBUTROL 1 MMOL/ML IV SOLN COMPARISON:  Head CT earlier same day FINDINGS: MRI HEAD FINDINGS Brain: Diffusion imaging does not show any acute or subacute infarction. There are mild chronic small-vessel ischemic changes of the pons. Very few foci of T2 and FLAIR signal within the hemispheric white matter consistent with minimal small vessel change. No cortical or large vessel territory infarction. No mass lesion, hemorrhage, hydrocephalus or extra-axial collection. Vascular: Major vessels at the base of the brain show flow. Skull and upper cervical spine: Negative Sinuses/Orbits: Paranasal sinuses are clear.  Orbits appear normal. Other: Left mastoid effusion. MRA HEAD FINDINGS Both internal carotid arteries widely patent through the skull base and siphon regions. There is either an infundibulum or a 2 mm aneurysm at the left posterior communicating artery origin. Anterior and middle cerebral vessels appear normal. Both vertebral arteries widely patent to the basilar. No basilar stenosis. Posterior circulation branch vessels are patent. Some irregularity of the more distal PCA branches. MRA NECK FINDINGS Branching pattern is normal. No origin stenosis. Both common carotid  arteries widely patent to the bifurcation. The vessels are tortuous as might be seen with hypertension. No carotid bifurcation stenosis or irregularity. Cervical internal carotid arteries are tortuous but widely patent. No sign of dissection. Both  vertebral artery origins are poorly seen because of chest motion. Beyond the origins, the vessels are widely patent through the cervical region to the foramen magnum. The vessels are somewhat tortuous but there is no sign of vertebral dissection. IMPRESSION: MRI head: No acute finding. Mild chronic small-vessel change of the pons and cerebral hemispheric white matter. MRA head: No large or medium vessel occlusion. 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 MRA neck: No sign of dissection. The vessels are tortuous, as might be seen with a history of hypertension. No carotid bifurcation stenosis or irregularity. Vertebral artery origins are not well seen due to chest motion. Electronically Signed   By: Paulina Fusi M.D.   On: 06/08/2020 14:41   ECHOCARDIOGRAM COMPLETE BUBBLE STUDY  Result Date: 06/09/2020    ECHOCARDIOGRAM REPORT   Patient Name:   LEVORA WERDEN Date of Exam: 06/09/2020 Medical Rec #:  [id number]  Height:       58.0 in Accession #:    [id number] Weight:       170.0 lb Date of Birth:  1955/06/03  BSA:          1.700 m Patient Age:    64 years   BP:           163/88 mmHg Patient Gender: F          HR:           62 bpm. Exam Location:  [facility] Procedure: 2D Echo, Color Doppler, Cardiac Doppler and Saline Contrast Bubble            Study Indications:     G45.9 TIA  History:         Patient has no prior history of Echocardiogram examinations.                  Signs/Symptoms:Slurred speech, left-sided weakness.  Sonographer:     Humphrey Rolls RDCS (AE) Referring Phys:  [id number] Brien Few NIU Diagnosing Phys: Debbe Odea MD  Sonographer Comments: Suboptimal subcostal window. IMPRESSIONS  1. Left ventricular ejection fraction, by estimation, is 60 to 65%. The left ventricle has normal function. The left ventricle has no regional wall motion abnormalities. There is mild left ventricular hypertrophy. Left ventricular diastolic parameters are consistent with Grade I diastolic  dysfunction (impaired relaxation).  2. Right ventricular systolic function is normal. The right ventricular size is normal.  3. The mitral valve is normal in structure. No evidence of mitral valve regurgitation.  4. The aortic valve is tricuspid. Aortic valve regurgitation is mild.  5. The inferior vena cava is normal in size with greater than 50% respiratory variability, suggesting right atrial pressure of 3 mmHg. FINDINGS  Left Ventricle: Left ventricular ejection fraction, by estimation, is 60 to 65%. The left ventricle has normal function. The left ventricle has no regional wall motion abnormalities. The left ventricular internal cavity size was normal in size. There is  mild left ventricular hypertrophy. Left ventricular diastolic parameters are consistent with Grade I diastolic dysfunction (impaired relaxation). Right Ventricle: The right ventricular size is normal. No increase in right ventricular wall thickness. Right ventricular systolic function is normal. Left Atrium: Left atrial size was normal in size. Right Atrium: Right atrial size was normal in size. Pericardium: There is no  evidence of pericardial effusion. Mitral Valve: The mitral valve is normal in structure. No evidence of mitral valve regurgitation. MV peak gradient, 4.8 mmHg. The mean mitral valve gradient is 1.0 mmHg. Tricuspid Valve: The tricuspid valve is normal in structure. Tricuspid valve regurgitation is mild. Aortic Valve: The aortic valve is tricuspid. Aortic valve regurgitation is mild. Aortic valve mean gradient measures 3.0 mmHg. Aortic valve peak gradient measures 7.0 mmHg. Aortic valve area, by VTI measures 2.58 cm. Pulmonic Valve: The pulmonic valve was normal in structure. Pulmonic valve regurgitation is not visualized. Aorta: The aortic root is normal in size and structure. Venous: The inferior vena cava is normal in size with greater than 50% respiratory variability, suggesting right atrial pressure of 3 mmHg. IAS/Shunts: No  atrial level shunt detected by color flow Doppler. Agitated saline contrast was given intravenously to evaluate for intracardiac shunting.  LEFT VENTRICLE PLAX 2D LVIDd:         4.90 cm  Diastology LVIDs:         3.10 cm  LV e' medial:    5.66 cm/s LV PW:         1.20 cm  LV E/e' medial:  11.1 LV IVS:        0.90 cm  LV e' lateral:   7.18 cm/s LVOT diam:     2.00 cm  LV E/e' lateral: 8.7 LV SV:         60 LV SV Index:   35 LVOT Area:     3.14 cm  RIGHT VENTRICLE RV Basal diam:  3.80 cm LEFT ATRIUM             Index       RIGHT ATRIUM           Index LA diam:        3.40 cm 2.00 cm/m  RA Area:     11.30 cm LA Vol (A2C):   42.5 ml 25.01 ml/m RA Volume:   25.70 ml  15.12 ml/m LA Vol (A4C):   32.5 ml 19.12 ml/m LA Biplane Vol: 37.5 ml 22.06 ml/m  AORTIC VALVE                   PULMONIC VALVE AV Area (Vmax):    2.13 cm    PV Vmax:       0.82 m/s AV Area (Vmean):   2.31 cm    PV Vmean:      56.600 cm/s AV Area (VTI):     2.58 cm    PV VTI:        0.160 m AV Vmax:           132.00 cm/s PV Peak grad:  2.7 mmHg AV Vmean:          80.000 cm/s PV Mean grad:  1.0 mmHg AV VTI:            0.234 m AV Peak Grad:      7.0 mmHg AV Mean Grad:      3.0 mmHg LVOT Vmax:         89.40 cm/s LVOT Vmean:        58.900 cm/s LVOT VTI:          0.192 m LVOT/AV VTI ratio: 0.82  AORTA Ao Root diam: 3.70 cm MITRAL VALVE                TRICUSPID VALVE MV Area (PHT): 2.68 cm     TR Peak grad:  21.7 mmHg MV Area VTI:   2.36 cm     TR Vmax:        233.00 cm/s MV Peak grad:  4.8 mmHg MV Mean grad:  1.0 mmHg     SHUNTS MV Vmax:       1.09 m/s     Systemic VTI:  0.19 m MV Vmean:      53.5 cm/s    Systemic Diam: 2.00 cm MV Decel Time: 283 msec MV E velocity: 62.60 cm/s MV A velocity: 107.00 cm/s MV E/A ratio:  0.59 Debbe Odea MD Electronically signed by Debbe Odea MD Signature Date/Time: 06/09/2020/12:59:05 PM    Final     DISCHARGE EXAMINATION: Vitals:   06/09/20 0042 06/09/20 0448 06/09/20 0834 06/09/20 1116  BP: (!)  178/78 (!) 154/92 (!) 163/88 (!) 162/85  Pulse: (!) 58 64 69 63  Resp: 18 18 16 18   Temp: 97.7 F (36.5 C) (!) 97.4 F (36.3 C) 97.7 F (36.5 C) 98 F (36.7 C)  TempSrc: Oral Oral Oral Oral  SpO2: 100% 100% 100% 100%  Weight:      Height:       General appearance: Awake alert.  In no distress Resp: Clear to auscultation bilaterally.  Normal effort Cardio: S1-S2 is normal regular.  No S3-S4.  No rubs murmurs or bruit GI: Abdomen is soft.  Nontender nondistended.  Bowel sounds are present normal.  No masses organomegaly   DISPOSITION: Home with daughter  Discharge Instructions    Ambulatory referral to Neurology   Complete by: As directed    Hospital f/u TIA within 4-6 wks. Patient is non-English speaking. Please contact daughter [phone] to set up appt   Call MD for:  extreme fatigue   Complete by: As directed    Call MD for:  persistant dizziness or light-headedness   Complete by: As directed    Call MD for:  persistant nausea and vomiting   Complete by: As directed    Call MD for:  severe uncontrolled pain   Complete by: As directed    Call MD for:  temperature >100.4   Complete by: As directed    Diet - low sodium heart healthy   Complete by: As directed    Discharge instructions   Complete by: As directed    Please establish with a primary care physician and have them refer you to a neurologist for follow-up in the next 3 to 4 weeks.  Seek attention if symptoms recur. Please have your blood pressure checked in the next few days. Your HbA1c, which is a marker of diabetes, is 6.4.  You should have this reevaluated by your PCP and if it still around that number then you will need to be started on medication.   You were cared for by a hospitalist during your hospital stay. If you have any questions about your discharge medications or the care you received while you were in the hospital after you are discharged, you can call the unit and asked to speak  with the hospitalist on call if the hospitalist that took care of you is not available. Once you are discharged, your primary care physician will handle any further medical issues. Please note that NO REFILLS for any discharge medications will be authorized once you are discharged, as it is imperative that you return to your primary care physician (or establish a relationship with a primary care physician if you do not have one) for your aftercare needs so  that they can reassess your need for medications and monitor your lab values. If you do not have a primary care physician, you can call [phone] for a physician referral.   Increase activity slowly   Complete by: As directed        Allergies as of 06/09/2020   No Known Allergies     Medication List    TAKE these medications   amLODipine 5 MG tablet Commonly known as: NORVASC Take 1 tablet (5 mg total) by mouth daily.   aspirin 81 MG EC tablet Take 1 tablet (81 mg total) by mouth daily. Swallow whole.   atorvastatin 80 MG tablet Commonly known as: LIPITOR Take 1 tablet (80 mg total) by mouth daily.         Follow-up Information    Lonell Face, MD. Schedule an appointment as soon as possible for a visit in 3 week(s).   Specialty: Neurology Contact information: [address] [facility] [place] [address] [phone]               TOTAL DISCHARGE TIME: 35 minutes  Alisen Marsiglia Rito Ehrlich  [facility] Pager on [email or web address]  06/10/2020, 11:46 AM

## 2020-06-09 NOTE — Progress Notes (Signed)
Neurology Progress Note  Patient ID: 65 yo admitted for TIA w/u after recurrent episodes of L face and arm numbness and weakness with dysarthria, both resolved.  Subjective: - No stroke on MRI - No hemodynamically-significant stenoses on MRA H&N - No intracardiac clot or significant abnl on TTE - Asymptomatic at this time  Exam: Vitals:   06/09/20 0834 06/09/20 1116  BP: (!) 163/88 (!) 162/85  Pulse: 69 63  Resp: 16 18  Temp: 97.7 F (36.5 C) 98 F (36.7 C)  SpO2: 100% 100%   Gen: In bed, NAD Resp: non-labored breathing, no acute distress Abd: soft, nt  Physical Exam Gen: A&O x4, NAD HEENT: Atraumatic, normocephalic;mucous membranes moist; oropharynx clear, tongue without atrophy or fasciculations. Neck: Supple, trachea midline. Resp: CTAB, no w/r/r CV: RRR, no m/g/r; nml S1 and S2. 2+ symmetric peripheral pulses. Abd: soft/NT/ND; nabs x 4 quad Extrem: Nml bulk; no cyanosis, clubbing, or edema.  Neuro: *MS: A&O x4. Follows multi-step commands. *Speech: fluent, nondysarthric. No aphasia. Naming and repetition intact.  *CN:    I: Deferred   II,III: PERRLA, VFF by confrontation, optic discs sharp   III,IV,VI: EOMI w/o nystagmus, no ptosis   V: Sensation intact from V1 to V3 to LT   VII: Eyelid closure was full.  Smile symmetric.   VIII: Hearing intact to voice   IX,X: Voice normal, palate elevates symmetrically    XI: SCM/trap 5/5 bilat   XII: Tongue protrudes midline, no atrophy or fasciculations   *Motor:   Normal bulk.  No tremor, rigidity or bradykinesia. No pronator drift.    Strength: Dlt Bic Tri WrE WrF FgS Gr HF KnF KnE PlF DoF    Left 5 5 5 5 5 5 5 5 5 5 5 5     Right 5 5 5 5 5 5 5 5 5 5 5 5     *Sensory: Intact to light touch, pinprick, temperature vibration throughout. Symmetric. Propioception intact bilat.  No double-simultaneous extinction.  *Coordination:  Finger-to-nose, heel-to-shin, rapid alternating motions were intact. *Reflexes:  2+ and symmetric  throughout without clonus; toes down-going bilat *Gait: normal base, normal stride, normal turn. Negative Romberg.  NIHSS = 0  A1c 6.4 LDL 152.5  Impression: 65 yo admitted after recurrent episodes of L face and arm numbness and weakness with dysarthria, both resolved c/w TIA.  Recommendations: 1) TIA w/u completed 2) d/c on ASA 81mg  daily + atorvastatin 80mg  daily 3) Referrals made for outpatient neurology and PCP 4) stroke return precautions reviewed 5) OK to d/c from neuro standpoint  , MD [facility] [phone]  If 7pm- 7am, please page neurology on call as listed in AMION.

## 2021-07-01 ENCOUNTER — Other Ambulatory Visit: Payer: Self-pay | Admitting: Internal Medicine

## 2021-07-01 DIAGNOSIS — Z1231 Encounter for screening mammogram for malignant neoplasm of breast: Secondary | ICD-10-CM

## 2022-06-27 ENCOUNTER — Ambulatory Visit: Payer: Medicare Other | Admitting: Internal Medicine

## 2022-06-27 ENCOUNTER — Encounter: Payer: Self-pay | Admitting: Internal Medicine

## 2022-06-27 VITALS — BP 128/78 | HR 81 | Ht 60.0 in | Wt 175.0 lb

## 2022-06-27 DIAGNOSIS — E1165 Type 2 diabetes mellitus with hyperglycemia: Secondary | ICD-10-CM | POA: Diagnosis not present

## 2022-06-27 DIAGNOSIS — I10 Essential (primary) hypertension: Secondary | ICD-10-CM

## 2022-06-27 DIAGNOSIS — E782 Mixed hyperlipidemia: Secondary | ICD-10-CM | POA: Diagnosis not present

## 2022-06-27 DIAGNOSIS — K219 Gastro-esophageal reflux disease without esophagitis: Secondary | ICD-10-CM

## 2022-06-27 DIAGNOSIS — R1010 Upper abdominal pain, unspecified: Secondary | ICD-10-CM

## 2022-06-27 LAB — POCT CBG (FASTING - GLUCOSE)-MANUAL ENTRY: Glucose Fasting, POC: 117 mg/dL — AB (ref 70–99)

## 2022-06-27 MED ORDER — PANTOPRAZOLE SODIUM 40 MG PO TBEC: 40.0000 mg | DELAYED_RELEASE_TABLET | Freq: Every day | ORAL | 3 refills | Status: DC

## 2022-06-27 MED ORDER — AMLODIPINE BESYLATE 5 MG PO TABS: 5.0000 mg | ORAL_TABLET | Freq: Every day | ORAL | 1 refills | Status: DC

## 2022-06-27 MED ORDER — METFORMIN HCL ER 500 MG PO TB24: 500.0000 mg | ORAL_TABLET | Freq: Every day | ORAL | 3 refills | Status: DC

## 2022-06-27 MED ORDER — ATORVASTATIN CALCIUM 80 MG PO TABS
80.0000 mg | ORAL_TABLET | Freq: Every day | ORAL | 2 refills | Status: DC
Start: 2022-06-27 — End: 2023-01-29

## 2022-06-27 NOTE — Progress Notes (Signed)
Established Patient Office Visit  Subjective:  Patient ID: Briana Swanson, female    DOB: [birth year]  Age: 67 y.o. MRN: [id number]  Chief Complaint  Patient presents with   Follow-up    Follow up    Patient comes in for follow-up today.  She has been traveling and [place] to [place].  Reports that she ran out of her medications and has gained some weight.  She complains of midepigastric pain and generalized abdominal soreness.  She does not have any nausea or vomiting, no diarrhea but does get constipation.  Linzess was tried before but did not help however she responds well to Colace.  Advised to start taking fiber colon or Metamucil on a daily basis.  She will get blood work done today.  Also to schedule her abdominal ultrasound as she is complaining of indigestion.  Patient has several food allergies including to milk, egg white, beef,wheat and sesame seeds.    No other concerns at this time.   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    Essential hypertension, benign    GERD with esophagitis    Mixed hyperlipidemia    Type 2 diabetes mellitus with hyperglycemia (HCC)     Past Surgical History:  Procedure Laterality Date   ABDOMINAL HYSTERECTOMY      Social History   Socioeconomic History   Marital status: Widowed    Spouse name: Not on file   Number of children: Not on file   Years of education: Not on file   Highest education level: Not on file  Occupational History   Not on file  Tobacco Use   Smoking status: Never   Smokeless tobacco: Never  Substance and Sexual Activity   Alcohol use: Never   Drug use: Never   Sexual activity: Not on file  Other Topics Concern   Not on file  Social History Narrative   Not on file   Social Determinants of Health   Financial Resource Strain: Not on file  Food Insecurity: Not on file  Transportation Needs: Not on file  Physical Activity: Not on file  Stress: Not on file  Social  Connections: Not on file  Intimate Partner Violence: Not on file    Family History  Problem Relation Age of Onset   Stroke Sister    Heart disease Brother     No Known Allergies  Review of Systems  Constitutional:  Negative for chills, diaphoresis, fever, malaise/fatigue and weight loss.  HENT: Negative.    Eyes: Negative.   Respiratory:  Negative for cough and shortness of breath.   Cardiovascular:  Negative for chest pain, palpitations, leg swelling and PND.  Gastrointestinal:  Positive for abdominal pain, constipation and heartburn. Negative for blood in stool, diarrhea, melena and nausea.  Genitourinary: Negative.   Musculoskeletal:  Negative for falls, joint pain and myalgias.  Neurological:  Negative for dizziness, tingling, tremors, seizures, loss of consciousness, weakness and headaches.  Psychiatric/Behavioral:  Negative for depression. The patient is not nervous/anxious and does not have insomnia.        Objective:   BP 128/78   Pulse 81   Ht 5' (1.524 m)   Wt 175 lb (79.4 kg)   SpO2 95%   BMI 34.18 kg/m   Vitals:   06/27/22 1324  BP: 128/78  Pulse: 81  Height: 5' (1.524 m)  Weight: 175 lb (79.4 kg)  SpO2: 95%  BMI (Calculated): 34.18    Physical  Exam Vitals and nursing note reviewed.  Constitutional:      General: She is not in acute distress.    Appearance: Normal appearance.  HENT:     Nose: Nose normal. No congestion or rhinorrhea.     Mouth/Throat:     Mouth: Mucous membranes are dry.     Pharynx: No oropharyngeal exudate or posterior oropharyngeal erythema.  Cardiovascular:     Rate and Rhythm: Normal rate and regular rhythm.     Pulses: Normal pulses.     Heart sounds: Normal heart sounds. No murmur heard.    No gallop.  Pulmonary:     Effort: Pulmonary effort is normal.     Breath sounds: Normal breath sounds.  Abdominal:     General: Bowel sounds are normal. There is no distension.     Palpations: Abdomen is soft. There is no mass.      Tenderness: There is abdominal tenderness. There is no right CVA tenderness, left CVA tenderness, guarding or rebound.     Hernia: No hernia is present.  Musculoskeletal:        General: Normal range of motion.     Cervical back: Normal range of motion and neck supple.     Right lower leg: No edema.     Left lower leg: No edema.  Skin:    General: Skin is warm and dry.  Neurological:     Mental Status: She is alert.  Psychiatric:        Mood and Affect: Mood normal.        Behavior: Behavior normal.      Results for orders placed or performed in visit on 06/27/22  POCT CBG (Fasting - Glucose)  Result Value Ref Range   Glucose Fasting, POC 117 (A) 70 - 99 mg/dL    Recent Results (from the past 2160 hour(s))  POCT CBG (Fasting - Glucose)     Status: Abnormal   Collection Time: 06/27/22  1:29 PM  Result Value Ref Range   Glucose Fasting, POC 117 (A) 70 - 99 mg/dL      Assessment & Plan:  Patient will get blood work done today.  Schedule abdominal ultrasound.  Refill all medications. Problem List Items Addressed This Visit     Type 2 diabetes mellitus with hyperglycemia, without long-term current use of insulin (HCC) - Primary   Relevant Medications   atorvastatin (LIPITOR) 80 MG tablet   metFORMIN (GLUCOPHAGE-XR) 500 MG 24 hr tablet   Other Relevant Orders   POCT CBG (Fasting - Glucose) (Completed)   Hemoglobin A1c   Pain of upper abdomen   Relevant Orders   US Abdomen Complete   Lipase   Mixed hyperlipidemia   Relevant Medications   amLODipine (NORVASC) 5 MG tablet   atorvastatin (LIPITOR) 80 MG tablet   Other Relevant Orders   Lipid Panel w/o Chol/HDL Ratio   Essential hypertension, benign   Relevant Medications   amLODipine (NORVASC) 5 MG tablet   atorvastatin (LIPITOR) 80 MG tablet   Other Relevant Orders   CMP14+EGFR   Gastroesophageal reflux disease without esophagitis   Relevant Medications   pantoprazole (PROTONIX) 40 MG tablet   Other Relevant  Orders   CBC With Differential    Return in about 2 weeks (around 07/11/2022).   Total time spent: 30 minutes  Margaretann Loveless, MD  06/27/2022   This document may have been prepared by [facility] Voice Recognition software and as such may include unintentional dictation errors.

## 2022-06-28 LAB — HEMOGLOBIN A1C
Est. average glucose Bld gHb Est-mCnc: 140 mg/dL
Hgb A1c MFr Bld: 6.5 % — ABNORMAL HIGH (ref 4.8–5.6)

## 2022-06-28 LAB — CMP14+EGFR
ALT: 27 IU/L (ref 0–32)
AST: 20 IU/L (ref 0–40)
Albumin/Globulin Ratio: 1.4
Albumin: 4.1 g/dL (ref 3.9–4.9)
Alkaline Phosphatase: 81 IU/L (ref 44–121)
BUN/Creatinine Ratio: 27 (ref 12–28)
BUN: 18 mg/dL (ref 8–27)
Bilirubin Total: <0.2 mg/dL (ref 0.0–1.2)
CO2: 21 mmol/L (ref 20–29)
Calcium: 9.5 mg/dL (ref 8.7–10.3)
Chloride: 102 mmol/L (ref 96–106)
Creatinine, Ser: 0.67 mg/dL (ref 0.57–1.00)
Globulin, Total: 3 g/dL (ref 1.5–4.5)
Glucose: 101 mg/dL — ABNORMAL HIGH (ref 70–99)
Potassium: 4 mmol/L (ref 3.5–5.2)
Sodium: 138 mmol/L (ref 134–144)
Total Protein: 7.1 g/dL (ref 6.0–8.5)
eGFR: 96 mL/min/{1.73_m2} (ref >59)

## 2022-06-28 LAB — CBC WITH DIFFERENTIAL
Basophils Absolute: 0 10*3/uL (ref 0.0–0.2)
Basos: 0 %
EOS (ABSOLUTE): 0.2 10*3/uL (ref 0.0–0.4)
Eos: 3 %
Hematocrit: 36 % (ref 34.0–46.6)
Hemoglobin: 11.3 g/dL (ref 11.1–15.9)
Immature Grans (Abs): 0 10*3/uL (ref 0.0–0.1)
Immature Granulocytes: 0 %
Lymphocytes Absolute: 2.4 10*3/uL (ref 0.7–3.1)
Lymphs: 32 %
MCH: 23.5 pg — ABNORMAL LOW (ref 26.6–33.0)
MCHC: 31.4 g/dL — ABNORMAL LOW (ref 31.5–35.7)
MCV: 75 fL — ABNORMAL LOW (ref 79–97)
Monocytes Absolute: 0.6 10*3/uL (ref 0.1–0.9)
Monocytes: 8 %
Neutrophils Absolute: 4.1 10*3/uL (ref 1.4–7.0)
Neutrophils: 57 %
RBC: 4.8 x10E6/uL (ref 3.77–5.28)
RDW: 16.7 % — ABNORMAL HIGH (ref 11.7–15.4)
WBC: 7.4 10*3/uL (ref 3.4–10.8)

## 2022-06-28 LAB — LIPID PANEL W/O CHOL/HDL RATIO
Cholesterol, Total: 159 mg/dL (ref 100–199)
HDL: 45 mg/dL (ref >39)
LDL Chol Calc (NIH): 53 mg/dL (ref 0–99)
Triglycerides: 408 mg/dL — ABNORMAL HIGH (ref 0–149)
VLDL Cholesterol Cal: 61 mg/dL — ABNORMAL HIGH (ref 5–40)

## 2022-06-28 LAB — LIPASE: Lipase: 64 U/L (ref 14–72)

## 2022-07-12 ENCOUNTER — Ambulatory Visit (INDEPENDENT_AMBULATORY_CARE_PROVIDER_SITE_OTHER): Payer: Medicare Other

## 2022-07-12 DIAGNOSIS — R1013 Epigastric pain: Secondary | ICD-10-CM

## 2022-07-12 DIAGNOSIS — R1010 Upper abdominal pain, unspecified: Secondary | ICD-10-CM

## 2022-07-18 ENCOUNTER — Ambulatory Visit: Payer: Medicare Other | Admitting: Internal Medicine

## 2022-07-18 ENCOUNTER — Encounter: Payer: Self-pay | Admitting: Internal Medicine

## 2022-07-18 VITALS — BP 110/80 | HR 79 | Ht 60.0 in | Wt 179.8 lb

## 2022-07-18 DIAGNOSIS — E1165 Type 2 diabetes mellitus with hyperglycemia: Secondary | ICD-10-CM | POA: Diagnosis not present

## 2022-07-18 DIAGNOSIS — E782 Mixed hyperlipidemia: Secondary | ICD-10-CM

## 2022-07-18 DIAGNOSIS — I10 Essential (primary) hypertension: Secondary | ICD-10-CM

## 2022-07-18 DIAGNOSIS — Z1211 Encounter for screening for malignant neoplasm of colon: Secondary | ICD-10-CM

## 2022-07-18 DIAGNOSIS — K219 Gastro-esophageal reflux disease without esophagitis: Secondary | ICD-10-CM

## 2022-07-18 NOTE — Progress Notes (Signed)
Established Patient Office Visit  Subjective:  Patient ID: Briana Swanson, female    DOB: 03/02/1955  Age: 67 y.o. MRN: [id number]  Chief Complaint  Patient presents with   Follow-up    2 week F/U    Patient comes in with her daughter for a follow-up and to discuss her lab results.  Her blood pressure is stable today.  Her hemoglobin A1c is also stable.  However her H&H has dropped.  Patient is postmenopausal.  She has never been screened for colon cancer.  Will check Hemoccult cards and schedule a Cologuard as well.   She was having some epigastric discomfort which has now resolved since started on Protonix.  Her abdominal ultrasound showed mild fatty liver. They are reluctant to see a gastroenterologist at this time. She has gained weight and her appetite is very good .  Triglycerides are very high at this time.  Patient advised to cut back on her her calorie intake to avoid high cholesterol, carb rich foods.      No other concerns at this time.   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    Essential hypertension, benign    GERD with esophagitis    Mixed hyperlipidemia    Type 2 diabetes mellitus with hyperglycemia (HCC)     Past Surgical History:  Procedure Laterality Date   ABDOMINAL HYSTERECTOMY      Social History   Socioeconomic History   Marital status: Widowed    Spouse name: Not on file   Number of children: Not on file   Years of education: Not on file   Highest education level: Not on file  Occupational History   Not on file  Tobacco Use   Smoking status: Never   Smokeless tobacco: Never  Substance and Sexual Activity   Alcohol use: Never   Drug use: Never   Sexual activity: Not on file  Other Topics Concern   Not on file  Social History Narrative   Not on file   Social Determinants of Health   Financial Resource Strain: Not on file  Food Insecurity: Not on file  Transportation Needs: Not on file  Physical Activity: Not on  file  Stress: Not on file  Social Connections: Not on file  Intimate Partner Violence: Not on file    Family History  Problem Relation Age of Onset   Stroke Sister    Heart disease Brother     No Known Allergies  Review of Systems  Constitutional: Negative.   HENT: Negative.  Negative for congestion and sore throat.   Eyes: Negative.   Respiratory: Negative.  Negative for cough, shortness of breath and wheezing.   Cardiovascular: Negative.  Negative for chest pain, palpitations and leg swelling.  Gastrointestinal: Negative.  Negative for abdominal pain, blood in stool, constipation, diarrhea, heartburn, melena, nausea and vomiting.  Genitourinary: Negative.  Negative for dysuria and flank pain.  Musculoskeletal: Negative.  Negative for joint pain and myalgias.  Skin: Negative.   Neurological: Negative.  Negative for dizziness and headaches.  Endo/Heme/Allergies: Negative.   Psychiatric/Behavioral: Negative.  Negative for depression and suicidal ideas. The patient is not nervous/anxious.        Objective:   BP 110/80   Pulse 79   Ht 5' (1.524 m)   Wt 179 lb 12.8 oz (81.6 kg)   SpO2 97%   BMI 35.11 kg/m   Vitals:   07/18/22 1330  BP: 110/80  Pulse: 79  Height: 5' (1.524 m)  Weight: 179 lb 12.8 oz (81.6 kg)  SpO2: 97%  BMI (Calculated): 35.11    Physical Exam Vitals and nursing note reviewed.  Constitutional:      Appearance: Normal appearance.  HENT:     Head: Normocephalic and atraumatic.     Nose: Nose normal.     Mouth/Throat:     Mouth: Mucous membranes are moist.     Pharynx: Oropharynx is clear.  Eyes:     Conjunctiva/sclera: Conjunctivae normal.     Pupils: Pupils are equal, round, and reactive to light.  Cardiovascular:     Rate and Rhythm: Normal rate and regular rhythm.     Pulses: Normal pulses.     Heart sounds: Normal heart sounds. No murmur heard. Pulmonary:     Effort: Pulmonary effort is normal.     Breath sounds: Normal breath  sounds. No wheezing, rhonchi or rales.  Abdominal:     General: Bowel sounds are normal.     Palpations: Abdomen is soft.     Tenderness: There is no abdominal tenderness. There is no right CVA tenderness or left CVA tenderness.  Musculoskeletal:        General: Normal range of motion.     Cervical back: Normal range of motion.     Right lower leg: No edema.     Left lower leg: No edema.  Skin:    General: Skin is warm and dry.  Neurological:     General: No focal deficit present.     Mental Status: She is alert and oriented to person, place, and time.  Psychiatric:        Mood and Affect: Mood normal.        Behavior: Behavior normal.      No results found for any visits on 07/18/22.  Recent Results (from the past 2160 hour(s))  POCT CBG (Fasting - Glucose)     Status: Abnormal   Collection Time: 06/27/22  1:29 PM  Result Value Ref Range   Glucose Fasting, POC 117 (A) 70 - 99 mg/dL  CBC With Differential     Status: Abnormal   Collection Time: 06/27/22  1:57 PM  Result Value Ref Range   WBC 7.4 3.4 - 10.8 x10E3/uL   RBC 4.80 3.77 - 5.28 x10E6/uL   Hemoglobin 11.3 11.1 - 15.9 g/dL   Hematocrit 11.9 14.7 - 46.6 %   MCV 75 (L) 79 - 97 fL   MCH 23.5 (L) 26.6 - 33.0 pg   MCHC 31.4 (L) 31.5 - 35.7 g/dL   RDW 82.9 (H) 56.2 - 13.0 %   Neutrophils 57 Not Estab. %   Lymphs 32 Not Estab. %   Monocytes 8 Not Estab. %   Eos 3 Not Estab. %   Basos 0 Not Estab. %   Neutrophils Absolute 4.1 1.4 - 7.0 x10E3/uL   Lymphocytes Absolute 2.4 0.7 - 3.1 x10E3/uL   Monocytes Absolute 0.6 0.1 - 0.9 x10E3/uL   EOS (ABSOLUTE) 0.2 0.0 - 0.4 x10E3/uL   Basophils Absolute 0.0 0.0 - 0.2 x10E3/uL   Immature Granulocytes 0 Not Estab. %   Immature Grans (Abs) 0.0 0.0 - 0.1 x10E3/uL    Comment: **Effective August 14, 2022, profile [id number] CBC/Differential**   (No Platelet) will be made non-orderable. [facility] Offers:   [id number] CBC With Differential/Platelet   CMP14+EGFR     Status: Abnormal    Collection Time: 06/27/22  1:57 PM  Result Value Ref Range   Glucose  101 (H) 70 - 99 mg/dL   BUN 18 8 - 27 mg/dL   Creatinine, Ser 1.61 0.57 - 1.00 mg/dL   eGFR 96 >09 UE/AVW/0.98   BUN/Creatinine Ratio 27 12 - 28   Sodium 138 134 - 144 mmol/L   Potassium 4.0 3.5 - 5.2 mmol/L   Chloride 102 96 - 106 mmol/L   CO2 21 20 - 29 mmol/L   Calcium 9.5 8.7 - 10.3 mg/dL   Total Protein 7.1 6.0 - 8.5 g/dL   Albumin 4.1 3.9 - 4.9 g/dL   Globulin, Total 3.0 1.5 - 4.5 g/dL   Albumin/Globulin Ratio 1.4    Bilirubin Total <0.2 0.0 - 1.2 mg/dL   Alkaline Phosphatase 81 44 - 121 IU/L   AST 20 0 - 40 IU/L   ALT 27 0 - 32 IU/L  Hemoglobin A1c     Status: Abnormal   Collection Time: 06/27/22  1:57 PM  Result Value Ref Range   Hgb A1c MFr Bld 6.5 (H) 4.8 - 5.6 %    Comment:          Prediabetes: 5.7 - 6.4          Diabetes: >6.4          Glycemic control for adults with diabetes: <7.0    Est. average glucose Bld gHb Est-mCnc 140 mg/dL  Lipid Panel w/o Chol/HDL Ratio     Status: Abnormal   Collection Time: 06/27/22  1:57 PM  Result Value Ref Range   Cholesterol, Total 159 100 - 199 mg/dL   Triglycerides 119 (H) 0 - 149 mg/dL   HDL 45 >14 mg/dL   VLDL Cholesterol Cal 61 (H) 5 - 40 mg/dL   LDL Chol Calc (NIH) 53 0 - 99 mg/dL  Lipase     Status: None   Collection Time: 06/27/22  1:57 PM  Result Value Ref Range   Lipase 64 14 - 72 U/L      Assessment & Plan:  Continue Protonix for now.  Check Hemoccult cards and Cologuard.  Strict diet control emphasized.  Can start oral iron.  Problem List Items Addressed This Visit     Type 2 diabetes mellitus with hyperglycemia, without long-term current use of insulin (HCC) - Primary   Mixed hyperlipidemia   Essential hypertension, benign   Gastroesophageal reflux disease without esophagitis   Other Visit Diagnoses     Colon cancer screening       Relevant Orders   Cologuard       Return in about 3 months (around 10/18/2022).   Total time  spent: 30 minutes  Margaretann Loveless, MD  07/18/2022   This document may have been prepared by [facility] Voice Recognition software and as such may include unintentional dictation errors.

## 2022-07-28 ENCOUNTER — Other Ambulatory Visit: Payer: Self-pay | Admitting: Internal Medicine

## 2022-07-28 DIAGNOSIS — Z1211 Encounter for screening for malignant neoplasm of colon: Secondary | ICD-10-CM

## 2022-07-28 LAB — COLOGUARD

## 2022-07-31 ENCOUNTER — Other Ambulatory Visit: Payer: Self-pay | Admitting: Internal Medicine

## 2022-07-31 DIAGNOSIS — Z1211 Encounter for screening for malignant neoplasm of colon: Secondary | ICD-10-CM

## 2022-08-11 ENCOUNTER — Other Ambulatory Visit (INDEPENDENT_AMBULATORY_CARE_PROVIDER_SITE_OTHER): Payer: Medicare Other

## 2022-08-11 DIAGNOSIS — Z1211 Encounter for screening for malignant neoplasm of colon: Secondary | ICD-10-CM

## 2022-08-11 LAB — POC HEMOCCULT BLD/STL (HOME/3-CARD/SCREEN)
Card #2 Fecal Occult Blod, POC: POSITIVE
Card #3 Fecal Occult Blood, POC: NEGATIVE
Fecal Occult Blood, POC: POSITIVE — AB

## 2022-08-21 ENCOUNTER — Other Ambulatory Visit: Payer: Self-pay | Admitting: Internal Medicine

## 2022-08-21 DIAGNOSIS — D500 Iron deficiency anemia secondary to blood loss (chronic): Secondary | ICD-10-CM

## 2022-08-22 NOTE — Progress Notes (Signed)
Patient notified

## 2022-08-29 NOTE — Progress Notes (Signed)
Patient notified

## 2022-10-19 ENCOUNTER — Encounter: Payer: Self-pay | Admitting: Internal Medicine

## 2022-10-19 ENCOUNTER — Ambulatory Visit (INDEPENDENT_AMBULATORY_CARE_PROVIDER_SITE_OTHER): Payer: Medicare Other | Admitting: Internal Medicine

## 2022-10-19 VITALS — BP 152/86 | HR 85 | Ht 63.0 in | Wt 181.0 lb

## 2022-10-19 DIAGNOSIS — I10 Essential (primary) hypertension: Secondary | ICD-10-CM

## 2022-10-19 DIAGNOSIS — D500 Iron deficiency anemia secondary to blood loss (chronic): Secondary | ICD-10-CM

## 2022-10-19 DIAGNOSIS — E1165 Type 2 diabetes mellitus with hyperglycemia: Secondary | ICD-10-CM | POA: Diagnosis not present

## 2022-10-19 DIAGNOSIS — E782 Mixed hyperlipidemia: Secondary | ICD-10-CM

## 2022-10-19 LAB — POCT CBG (FASTING - GLUCOSE)-MANUAL ENTRY: Glucose Fasting, POC: 128 mg/dL — AB (ref 70–99)

## 2022-10-19 MED ORDER — ASPIRIN 81 MG PO TBEC: 81.0000 mg | DELAYED_RELEASE_TABLET | Freq: Every day | ORAL | 11 refills | Status: DC

## 2022-10-19 NOTE — Progress Notes (Signed)
Established Patient Office Visit  Subjective:  Patient ID: Briana Swanson, female    DOB: 06/21/1955  Age: 67 y.o. MRN: [id number]  Chief Complaint  Patient presents with   Follow-up    3 month follow up    Patient comes in for follow-up accompanied by her daughter.  Her blood pressure is high today but she has not taken her medications and is fasting for blood.  According to daughter her blood pressure runs within normal limits at home. Has not complaints today of headaches or dizziness.  Last week had some viral infection with fever and sore throat but it has resolved.  No nausea vomiting, no diarrhea or constipation.  Her Cologuard test was negative.    No other concerns at this time.   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    Essential hypertension, benign    GERD with esophagitis    Mixed hyperlipidemia    Type 2 diabetes mellitus with hyperglycemia (HCC)     Past Surgical History:  Procedure Laterality Date   ABDOMINAL HYSTERECTOMY      Social History   Socioeconomic History   Marital status: Widowed    Spouse name: Not on file   Number of children: Not on file   Years of education: Not on file   Highest education level: Not on file  Occupational History   Not on file  Tobacco Use   Smoking status: Never   Smokeless tobacco: Never  Substance and Sexual Activity   Alcohol use: Never   Drug use: Never   Sexual activity: Not on file  Other Topics Concern   Not on file  Social History Narrative   Not on file   Social Determinants of Health   Financial Resource Strain: Not on file  Food Insecurity: Not on file  Transportation Needs: Not on file  Physical Activity: Not on file  Stress: Not on file  Social Connections: Not on file  Intimate Partner Violence: Not on file    Family History  Problem Relation Age of Onset   Stroke Sister    Heart disease Brother     No Known Allergies  Review of Systems  Constitutional:  Negative.  Negative for chills, diaphoresis, fever, malaise/fatigue and weight loss.  HENT: Negative.  Negative for ear discharge, sinus pain and sore throat.   Eyes: Negative.   Respiratory: Negative.  Negative for cough and shortness of breath.   Cardiovascular: Negative.  Negative for chest pain, palpitations and leg swelling.  Gastrointestinal: Negative.  Negative for abdominal pain, constipation, diarrhea, heartburn, nausea and vomiting.  Genitourinary: Negative.  Negative for dysuria and flank pain.  Musculoskeletal: Negative.  Negative for joint pain and myalgias.  Skin: Negative.   Neurological: Negative.  Negative for dizziness and headaches.  Endo/Heme/Allergies: Negative.   Psychiatric/Behavioral: Negative.  Negative for depression and suicidal ideas. The patient is not nervous/anxious.        Objective:   BP (!) 152/86   Pulse 85   Ht 5\' 3"  (1.6 m)   Wt 181 lb (82.1 kg)   SpO2 96%   BMI 32.06 kg/m   Vitals:   10/19/22 1003  BP: (!) 152/86  Pulse: 85  Height: 5\' 3"  (1.6 m)  Weight: 181 lb (82.1 kg)  SpO2: 96%  BMI (Calculated): 32.07    Physical Exam Vitals and nursing note reviewed.  Constitutional:      Appearance: Normal appearance.  HENT:  Head: Normocephalic and atraumatic.     Nose: Nose normal.     Mouth/Throat:     Mouth: Mucous membranes are moist.     Pharynx: Oropharynx is clear.  Eyes:     Conjunctiva/sclera: Conjunctivae normal.     Pupils: Pupils are equal, round, and reactive to light.  Cardiovascular:     Rate and Rhythm: Normal rate and regular rhythm.     Pulses: Normal pulses.     Heart sounds: Normal heart sounds. No murmur heard. Pulmonary:     Effort: Pulmonary effort is normal.     Breath sounds: Normal breath sounds. No wheezing.  Abdominal:     General: Bowel sounds are normal.     Palpations: Abdomen is soft.     Tenderness: There is no abdominal tenderness. There is no right CVA tenderness or left CVA tenderness.   Musculoskeletal:        General: Normal range of motion.     Cervical back: Normal range of motion.     Right lower leg: No edema.     Left lower leg: No edema.  Skin:    General: Skin is warm and dry.  Neurological:     General: No focal deficit present.     Mental Status: She is alert and oriented to person, place, and time.  Psychiatric:        Mood and Affect: Mood normal.        Behavior: Behavior normal.      Results for orders placed or performed in visit on 10/19/22  POCT CBG (Fasting - Glucose)  Result Value Ref Range   Glucose Fasting, POC 128 (A) 70 - 99 mg/dL    Recent Results (from the past 2160 hour(s))  Cologuard     Status: None   Collection Time: 07/26/22  8:00 AM  Result Value Ref Range   COLOGUARD Sample Could Not Be Processed 2 N/A    Comment: Addition of stabilization buffer to the specimen could not be verified. The patient will be contacted to initiate a new sample collection.  POC Hemoccult Bld/Stl (3-Cd Home Screen)     Status: Abnormal   Collection Time: 08/11/22 11:30 AM  Result Value Ref Range   Card #1 Date     Fecal Occult Blood, POC Positive (A) Negative   Card #2 Date     Card #2 Fecal Occult Blod, POC Positive    Card #3 Date     Card #3 Fecal Occult Blood, POC Negative   Cologuard     Status: None   Collection Time: 08/24/22  9:00 AM  Result Value Ref Range   COLOGUARD Negative Negative    Comment:  NEGATIVE TEST RESULT. A negative Cologuard result indicates a low likelihood that a colorectal cancer (CRC) or advanced adenoma (adenomatous polyps with more advanced pre-malignant features)  is present. The chance that a person with a negative Cologuard test has a colorectal cancer is less than 1 in 1500 (negative predictive value >99.9%) or has an  advanced adenoma is less than  5.3% (negative predictive value 94.7%). These data are based on a prospective cross-sectional study of 10,000 individuals at average risk for colorectal cancer who  were screened with both Cologuard and colonoscopy. (Imperiale T. et al, N Engl J Med 2014;370(14):1286-1297) The normal value (reference range) for this assay is negative.  COLOGUARD RE-SCREENING RECOMMENDATION: Periodic colorectal cancer screening is an important part of preventive healthcare for asymptomatic individuals at average risk for colorectal  cancer.  Following a negative Cologuard result, the American Cancer Society and U.[place]  Multi-Society Task Force screening guidelines recommend a Cologuard re-screening interval of 3 years.  References: American Cancer Society Guideline for Colorectal Cancer Screening: https://www.cancer.org/cancer/colon-rectal-cancer/detection-diagnosis-staging/acs-recommendations.html.; Rex DK, Boland CR, Dominitz JK, Colorectal Cancer Screening: Recommendations for Physicians and Patients from the U.S. Multi-Society Task Force on Colorectal Cancer Screening , Am J Gastroenterology 2017; 112:1016-1030.  TEST DESCRIPTION: Composite algorithmic analysis of stool DNA-biomarkers with hemoglobin immunoassay.   Quantitative values of individual biomarkers are not reportable and are not associated with individual biomarker result reference ranges. Cologuard is intended for colorectal cancer screening of adults of either sex, 45 years or older, who are at average-risk for colorectal cancer (CRC). Cologuard has been approved for use by the U.S. FDA. The performance of Cologuard was  established in a cross sectional study of average-risk adults aged 38-84. Cologuard performance in patients ages 63 to 82 years was estimated by sub-group analysis of near-age groups. Colonoscopies performed for a positive result may find as the most clinically significant lesion: colorectal cancer [4.0%], advanced adenoma (including sessile serrated polyps greater than or equal to 1cm diameter) [20%] or non- advanced adenoma [31%]; or no colorectal neoplasia [45%]. These estimates are derived from a  prospective cross-sectional screening study of 10,000 individuals at average risk for colorectal cancer who were screened with both Cologuard and colonoscopy. (Imperiale T. et al, Macy Mis J Med 2014;370(14):1286-1297.) Cologuard may produce a false negative or false positive result (no colorectal cancer or precancerous polyp present at colonoscopy follow up). A negative Cologuard test result does not guarantee the absence of CRC or advanced adenoma (pre-cancer). The current Cologuard  screening interval is every 3 years. Science writer and [place] Therapist, music). Cologuard performance data in a 10,000 patient pivotal study using colonoscopy as the reference method can be accessed at the following location: www.exactlabs.com/results. Additional description of the Cologuard test process, warnings and precautions can be found at www.cologuard.com.   POCT CBG (Fasting - Glucose)     Status: Abnormal   Collection Time: 10/19/22 10:07 AM  Result Value Ref Range   Glucose Fasting, POC 128 (A) 70 - 99 mg/dL      Assessment & Plan:  Continue all medications.  Monitor blood pressures at home.   Labs today. Problem List Items Addressed This Visit     Type 2 diabetes mellitus with hyperglycemia, without long-term current use of insulin (HCC) - Primary   Relevant Medications   aspirin EC 81 MG tablet   Other Relevant Orders   POCT CBG (Fasting - Glucose) (Completed)   Hemoglobin A1c   Mixed hyperlipidemia   Relevant Medications   aspirin EC 81 MG tablet   Other Relevant Orders   Lipid Panel w/o Chol/HDL Ratio   Essential hypertension, benign   Relevant Medications   aspirin EC 81 MG tablet   Other Relevant Orders   CMP14+EGFR   Iron deficiency anemia due to chronic blood loss   Relevant Orders   CBC with Diff    Return in about 3 months (around 01/19/2023).   Total time spent: 30 minutes  Margaretann Loveless, MD  10/19/2022   This document may have been prepared by [facility]  Voice Recognition software and as such may include unintentional dictation errors.

## 2022-10-20 LAB — LIPID PANEL W/O CHOL/HDL RATIO
Cholesterol, Total: 125 mg/dL (ref 100–199)
HDL: 49 mg/dL (ref >39)
LDL Chol Calc (NIH): 48 mg/dL (ref 0–99)
Triglycerides: 167 mg/dL — ABNORMAL HIGH (ref 0–149)
VLDL Cholesterol Cal: 28 mg/dL (ref 5–40)

## 2022-10-20 LAB — HEMOGLOBIN A1C
Est. average glucose Bld gHb Est-mCnc: 157 mg/dL
Hgb A1c MFr Bld: 7.1 % — ABNORMAL HIGH (ref 4.8–5.6)

## 2022-10-20 LAB — CMP14+EGFR
ALT: 30 [IU]/L (ref 0–32)
AST: 24 [IU]/L (ref 0–40)
Albumin: 4 g/dL (ref 3.9–4.9)
Alkaline Phosphatase: 87 [IU]/L (ref 44–121)
BUN/Creatinine Ratio: 25 (ref 12–28)
BUN: 18 mg/dL (ref 8–27)
Bilirubin Total: 0.2 mg/dL (ref 0.0–1.2)
CO2: 23 mmol/L (ref 20–29)
Calcium: 9.4 mg/dL (ref 8.7–10.3)
Chloride: 102 mmol/L (ref 96–106)
Creatinine, Ser: 0.71 mg/dL (ref 0.57–1.00)
Globulin, Total: 2.9 g/dL (ref 1.5–4.5)
Glucose: 118 mg/dL — ABNORMAL HIGH (ref 70–99)
Potassium: 4.7 mmol/L (ref 3.5–5.2)
Sodium: 140 mmol/L (ref 134–144)
Total Protein: 6.9 g/dL (ref 6.0–8.5)
eGFR: 93 mL/min/{1.73_m2} (ref >59)

## 2022-10-20 LAB — CBC WITH DIFFERENTIAL/PLATELET
Basophils Absolute: 0.1 10*3/uL (ref 0.0–0.2)
Basos: 1 %
EOS (ABSOLUTE): 0.2 10*3/uL (ref 0.0–0.4)
Eos: 3 %
Hematocrit: 36.1 % (ref 34.0–46.6)
Hemoglobin: 11.1 g/dL (ref 11.1–15.9)
Immature Grans (Abs): 0 10*3/uL (ref 0.0–0.1)
Immature Granulocytes: 1 %
Lymphocytes Absolute: 1.8 10*3/uL (ref 0.7–3.1)
Lymphs: 29 %
MCH: 22.5 pg — ABNORMAL LOW (ref 26.6–33.0)
MCHC: 30.7 g/dL — ABNORMAL LOW (ref 31.5–35.7)
MCV: 73 fL — ABNORMAL LOW (ref 79–97)
Monocytes Absolute: 0.5 10*3/uL (ref 0.1–0.9)
Monocytes: 9 %
Neutrophils Absolute: 3.6 10*3/uL (ref 1.4–7.0)
Neutrophils: 57 %
Platelets: 272 10*3/uL (ref 150–450)
RBC: 4.94 x10E6/uL (ref 3.77–5.28)
RDW: 15.5 % — ABNORMAL HIGH (ref 11.7–15.4)
WBC: 6.1 10*3/uL (ref 3.4–10.8)

## 2022-10-27 ENCOUNTER — Other Ambulatory Visit: Payer: Self-pay | Admitting: Internal Medicine

## 2022-10-27 DIAGNOSIS — I10 Essential (primary) hypertension: Secondary | ICD-10-CM

## 2023-01-19 ENCOUNTER — Ambulatory Visit: Payer: Medicare Other | Admitting: Internal Medicine

## 2023-01-28 ENCOUNTER — Other Ambulatory Visit: Payer: Self-pay | Admitting: Internal Medicine

## 2023-01-28 DIAGNOSIS — E782 Mixed hyperlipidemia: Secondary | ICD-10-CM

## 2023-02-02 ENCOUNTER — Encounter: Payer: Self-pay | Admitting: Internal Medicine

## 2023-02-02 ENCOUNTER — Ambulatory Visit (INDEPENDENT_AMBULATORY_CARE_PROVIDER_SITE_OTHER): Payer: Medicare Other | Admitting: Internal Medicine

## 2023-02-02 VITALS — BP 140/86 | HR 72 | Ht 60.0 in | Wt 179.0 lb

## 2023-02-02 DIAGNOSIS — E782 Mixed hyperlipidemia: Secondary | ICD-10-CM

## 2023-02-02 DIAGNOSIS — E1169 Type 2 diabetes mellitus with other specified complication: Secondary | ICD-10-CM | POA: Insufficient documentation

## 2023-02-02 DIAGNOSIS — I152 Hypertension secondary to endocrine disorders: Secondary | ICD-10-CM | POA: Insufficient documentation

## 2023-02-02 DIAGNOSIS — E1159 Type 2 diabetes mellitus with other circulatory complications: Secondary | ICD-10-CM | POA: Diagnosis not present

## 2023-02-02 DIAGNOSIS — Z1389 Encounter for screening for other disorder: Secondary | ICD-10-CM

## 2023-02-02 DIAGNOSIS — E1165 Type 2 diabetes mellitus with hyperglycemia: Secondary | ICD-10-CM

## 2023-02-02 DIAGNOSIS — I10 Essential (primary) hypertension: Secondary | ICD-10-CM

## 2023-02-02 DIAGNOSIS — K219 Gastro-esophageal reflux disease without esophagitis: Secondary | ICD-10-CM

## 2023-02-02 LAB — POCT URINALYSIS DIPSTICK
Bilirubin, UA: NEGATIVE
Blood, UA: NEGATIVE
Glucose, UA: NEGATIVE
Ketones, UA: NEGATIVE
Leukocytes, UA: NEGATIVE
Nitrite, UA: NEGATIVE
Protein, UA: NEGATIVE
Spec Grav, UA: 1.025 (ref 1.010–1.025)
Urobilinogen, UA: 0.2 U/dL
pH, UA: 6 (ref 5.0–8.0)

## 2023-02-02 LAB — POC CREATINE & ALBUMIN,URINE
Albumin/Creatinine Ratio, Urine, POC: <30
Creatinine, POC: 300 mg/dL
Microalbumin Ur, POC: 30 mg/L

## 2023-02-02 LAB — POCT CBG (FASTING - GLUCOSE)-MANUAL ENTRY: Glucose Fasting, POC: 116 mg/dL — AB (ref 70–99)

## 2023-02-02 MED ORDER — ATORVASTATIN CALCIUM 80 MG PO TABS: 80.0000 mg | ORAL_TABLET | Freq: Every day | ORAL | 3 refills | Status: DC

## 2023-02-02 MED ORDER — AMLODIPINE BESYLATE 5 MG PO TABS: 5.0000 mg | ORAL_TABLET | Freq: Every day | ORAL | 3 refills | Status: DC

## 2023-02-02 MED ORDER — ASPIRIN 81 MG PO TBEC: 81.0000 mg | DELAYED_RELEASE_TABLET | Freq: Every day | ORAL | 11 refills | Status: AC

## 2023-02-02 MED ORDER — METFORMIN HCL ER 500 MG PO TB24: 500.0000 mg | ORAL_TABLET | Freq: Every day | ORAL | 3 refills | Status: DC

## 2023-02-02 MED ORDER — PANTOPRAZOLE SODIUM 40 MG PO TBEC: 40.0000 mg | DELAYED_RELEASE_TABLET | Freq: Every day | ORAL | 3 refills | Status: DC

## 2023-02-02 NOTE — Progress Notes (Signed)
Established Patient Office Visit  Subjective:  Patient ID: Briana Swanson, female    DOB: 1955-04-02  Age: 68 y.o. MRN: [id number]  Chief Complaint  Patient presents with   Follow-up    3 month follow up    Patient comes in for her follow-up today, accompanied by her daughter.  She is generally feeling well and has no new complaints.  Currently getting ready for a long trip.  Requests 90-day supply on her medications.  She is also due for blood work and microalbumin and urine.  Lost some weight with strict diet control.    No other concerns at this time.   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    Essential hypertension, benign    GERD with esophagitis    Mixed hyperlipidemia    Type 2 diabetes mellitus with hyperglycemia (HCC)     Past Surgical History:  Procedure Laterality Date   ABDOMINAL HYSTERECTOMY      Social History   Socioeconomic History   Marital status: Widowed    Spouse name: Not on file   Number of children: Not on file   Years of education: Not on file   Highest education level: Not on file  Occupational History   Not on file  Tobacco Use   Smoking status: Never   Smokeless tobacco: Never  Substance and Sexual Activity   Alcohol use: Never   Drug use: Never   Sexual activity: Not on file  Other Topics Concern   Not on file  Social History Narrative   Not on file   Social Drivers of Health   Financial Resource Strain: Not on file  Food Insecurity: Not on file  Transportation Needs: Not on file  Physical Activity: Not on file  Stress: Not on file  Social Connections: Not on file  Intimate Partner Violence: Not on file    Family History  Problem Relation Age of Onset   Stroke Sister    Heart disease Brother     No Known Allergies  Outpatient Medications Prior to Visit  Medication Sig   [DISCONTINUED] amLODipine (NORVASC) 5 MG tablet TAKE 1 TABLET BY MOUTH EVERY DAY   [DISCONTINUED] aspirin EC 81 MG tablet  Take 1 tablet (81 mg total) by mouth daily. Swallow whole.   [DISCONTINUED] atorvastatin (LIPITOR) 80 MG tablet TAKE 1 TABLET BY MOUTH EVERY DAY   [DISCONTINUED] metFORMIN (GLUCOPHAGE-XR) 500 MG 24 hr tablet Take 1 tablet (500 mg total) by mouth daily with breakfast.   [DISCONTINUED] pantoprazole (PROTONIX) 40 MG tablet Take 1 tablet (40 mg total) by mouth daily.   No facility-administered medications prior to visit.    Review of Systems  Constitutional: Negative.  Negative for chills and fever.  HENT: Negative.    Eyes: Negative.   Respiratory: Negative.  Negative for cough and shortness of breath.   Cardiovascular: Negative.  Negative for chest pain, palpitations and leg swelling.  Gastrointestinal: Negative.  Negative for abdominal pain, constipation, diarrhea, heartburn, nausea and vomiting.  Genitourinary: Negative.  Negative for dysuria and flank pain.  Musculoskeletal: Negative.  Negative for joint pain and myalgias.  Skin: Negative.   Neurological: Negative.  Negative for dizziness and headaches.  Endo/Heme/Allergies: Negative.   Psychiatric/Behavioral: Negative.  Negative for depression and suicidal ideas. The patient is not nervous/anxious.        Objective:   BP (!) 140/86   Pulse 72   Ht 5' (1.524 m)   Wt 179 lb (  81.2 kg)   SpO2 97%   BMI 34.96 kg/m   Vitals:   02/02/23 1006  BP: (!) 140/86  Pulse: 72  Height: 5' (1.524 m)  Weight: 179 lb (81.2 kg)  SpO2: 97%  BMI (Calculated): 34.96    Physical Exam Vitals and nursing note reviewed.  Constitutional:      Appearance: Normal appearance.  HENT:     Head: Normocephalic and atraumatic.     Nose: Nose normal.     Mouth/Throat:     Mouth: Mucous membranes are moist.     Pharynx: Oropharynx is clear.  Eyes:     Conjunctiva/sclera: Conjunctivae normal.     Pupils: Pupils are equal, round, and reactive to light.  Cardiovascular:     Rate and Rhythm: Normal rate and regular rhythm.     Pulses: Normal  pulses.     Heart sounds: Normal heart sounds. No murmur heard. Pulmonary:     Effort: Pulmonary effort is normal.     Breath sounds: Normal breath sounds. No wheezing.  Abdominal:     General: Bowel sounds are normal.     Palpations: Abdomen is soft.     Tenderness: There is no abdominal tenderness. There is no right CVA tenderness or left CVA tenderness.  Musculoskeletal:        General: Normal range of motion.     Cervical back: Normal range of motion.     Right lower leg: No edema.     Left lower leg: No edema.  Skin:    General: Skin is warm and dry.  Neurological:     General: No focal deficit present.     Mental Status: She is alert and oriented to person, place, and time.  Psychiatric:        Mood and Affect: Mood normal.        Behavior: Behavior normal.      Results for orders placed or performed in visit on 02/02/23  POCT CBG (Fasting - Glucose)  Result Value Ref Range   Glucose Fasting, POC 116 (A) 70 - 99 mg/dL  POCT Urinalysis Dipstick (81002)  Result Value Ref Range   Color, UA yellow    Clarity, UA clear    Glucose, UA Negative Negative   Bilirubin, UA negative    Ketones, UA Negative    Spec Grav, UA 1.025 1.010 - 1.025   Blood, UA Negative    pH, UA 6.0 5.0 - 8.0   Protein, UA Negative Negative   Urobilinogen, UA 0.2 0.2 or 1.0 E.U./dL   Nitrite, UA Negative    Leukocytes, UA Negative Negative   Appearance Clear    Odor yes   POC CREATINE & ALBUMIN,URINE  Result Value Ref Range   Microalbumin Ur, POC 30 mg/L   Creatinine, POC 300 mg/dL   Albumin/Creatinine Ratio, Urine, POC <30     Recent Results (from the past 2160 hours)  POCT CBG (Fasting - Glucose)     Status: Abnormal   Collection Time: 02/02/23 10:09 AM  Result Value Ref Range   Glucose Fasting, POC 116 (A) 70 - 99 mg/dL  POC CREATINE & ALBUMIN,URINE     Status: None   Collection Time: 02/02/23 10:29 AM  Result Value Ref Range   Microalbumin Ur, POC 30 mg/L   Creatinine, POC 300  mg/dL   Albumin/Creatinine Ratio, Urine, POC <30   POCT Urinalysis Dipstick (16109)     Status: None   Collection Time: 02/02/23 10:30 AM  Result  Value Ref Range   Color, UA yellow    Clarity, UA clear    Glucose, UA Negative Negative   Bilirubin, UA negative    Ketones, UA Negative    Spec Grav, UA 1.025 1.010 - 1.025   Blood, UA Negative    pH, UA 6.0 5.0 - 8.0   Protein, UA Negative Negative   Urobilinogen, UA 0.2 0.2 or 1.0 E.U./dL   Nitrite, UA Negative    Leukocytes, UA Negative Negative   Appearance Clear    Odor yes       Assessment & Plan:  Continue all medications.   Monitor blood pressure.   Check labs today. Problem List Items Addressed This Visit     Type 2 diabetes mellitus with hyperglycemia, without long-term current use of insulin (HCC)   Relevant Medications   atorvastatin (LIPITOR) 80 MG tablet   metFORMIN (GLUCOPHAGE-XR) 500 MG 24 hr tablet   aspirin EC 81 MG tablet   Other Relevant Orders   POCT CBG (Fasting - Glucose) (Completed)   Hemoglobin A1c   POC CREATINE & ALBUMIN,URINE (Completed)   Mixed hyperlipidemia   Relevant Medications   atorvastatin (LIPITOR) 80 MG tablet   amLODipine (NORVASC) 5 MG tablet   aspirin EC 81 MG tablet   Essential hypertension, benign   Relevant Medications   atorvastatin (LIPITOR) 80 MG tablet   amLODipine (NORVASC) 5 MG tablet   aspirin EC 81 MG tablet   Gastroesophageal reflux disease without esophagitis   Relevant Medications   pantoprazole (PROTONIX) 40 MG tablet   Hypertension associated with diabetes (HCC) - Primary   Relevant Medications   atorvastatin (LIPITOR) 80 MG tablet   metFORMIN (GLUCOPHAGE-XR) 500 MG 24 hr tablet   amLODipine (NORVASC) 5 MG tablet   aspirin EC 81 MG tablet   Other Relevant Orders   CBC with Diff   CMP14+EGFR   Combined hyperlipidemia associated with type 2 diabetes mellitus (HCC)   Relevant Medications   atorvastatin (LIPITOR) 80 MG tablet   metFORMIN (GLUCOPHAGE-XR) 500  MG 24 hr tablet   amLODipine (NORVASC) 5 MG tablet   aspirin EC 81 MG tablet   Other Relevant Orders   Lipid Panel w/o Chol/HDL Ratio   Other Visit Diagnoses       Screening for blood or protein in urine       Relevant Orders   POCT Urinalysis Dipstick (81191) (Completed)       Return in about 4 months (around 06/02/2023).   Total time spent: 30 minutes  Margaretann Loveless, MD  02/02/2023   This document may have been prepared by [facility] Voice Recognition software and as such may include unintentional dictation errors.

## 2023-02-03 LAB — CMP14+EGFR
ALT: 41 [IU]/L — ABNORMAL HIGH (ref 0–32)
AST: 31 [IU]/L (ref 0–40)
Albumin: 4.3 g/dL (ref 3.9–4.9)
Alkaline Phosphatase: 70 [IU]/L (ref 44–121)
BUN/Creatinine Ratio: 22 (ref 12–28)
BUN: 14 mg/dL (ref 8–27)
Bilirubin Total: 0.3 mg/dL (ref 0.0–1.2)
CO2: 24 mmol/L (ref 20–29)
Calcium: 9.4 mg/dL (ref 8.7–10.3)
Chloride: 104 mmol/L (ref 96–106)
Creatinine, Ser: 0.65 mg/dL (ref 0.57–1.00)
Globulin, Total: 2.8 g/dL (ref 1.5–4.5)
Glucose: 92 mg/dL (ref 70–99)
Potassium: 4.8 mmol/L (ref 3.5–5.2)
Sodium: 140 mmol/L (ref 134–144)
Total Protein: 7.1 g/dL (ref 6.0–8.5)
eGFR: 96 mL/min/{1.73_m2} (ref >59)

## 2023-02-03 LAB — CBC WITH DIFFERENTIAL/PLATELET
Basophils Absolute: 0.1 10*3/uL (ref 0.0–0.2)
Basos: 1 %
EOS (ABSOLUTE): 0.2 10*3/uL (ref 0.0–0.4)
Eos: 2 %
Hematocrit: 41 % (ref 34.0–46.6)
Hemoglobin: 12.3 g/dL (ref 11.1–15.9)
Immature Grans (Abs): 0 10*3/uL (ref 0.0–0.1)
Immature Granulocytes: 0 %
Lymphocytes Absolute: 2.4 10*3/uL (ref 0.7–3.1)
Lymphs: 35 %
MCH: 24.2 pg — ABNORMAL LOW (ref 26.6–33.0)
MCHC: 30 g/dL — ABNORMAL LOW (ref 31.5–35.7)
MCV: 81 fL (ref 79–97)
Monocytes Absolute: 0.6 10*3/uL (ref 0.1–0.9)
Monocytes: 9 %
Neutrophils Absolute: 3.6 10*3/uL (ref 1.4–7.0)
Neutrophils: 53 %
Platelets: 231 10*3/uL (ref 150–450)
RBC: 5.08 x10E6/uL (ref 3.77–5.28)
RDW: 18 % — ABNORMAL HIGH (ref 11.7–15.4)
WBC: 6.8 10*3/uL (ref 3.4–10.8)

## 2023-02-03 LAB — LIPID PANEL W/O CHOL/HDL RATIO
Cholesterol, Total: 138 mg/dL (ref 100–199)
HDL: 52 mg/dL (ref >39)
LDL Chol Calc (NIH): 64 mg/dL (ref 0–99)
Triglycerides: 123 mg/dL (ref 0–149)
VLDL Cholesterol Cal: 22 mg/dL (ref 5–40)

## 2023-02-03 LAB — HEMOGLOBIN A1C
Est. average glucose Bld gHb Est-mCnc: 134 mg/dL
Hgb A1c MFr Bld: 6.3 % — ABNORMAL HIGH (ref 4.8–5.6)

## 2023-02-06 NOTE — Progress Notes (Signed)
Patient notified

## 2023-06-01 ENCOUNTER — Ambulatory Visit: Payer: Medicare Other | Admitting: Internal Medicine

## 2023-06-01 ENCOUNTER — Ambulatory Visit: Payer: Self-pay | Admitting: Internal Medicine

## 2023-06-01 ENCOUNTER — Encounter: Payer: Self-pay | Admitting: Internal Medicine

## 2023-06-01 VITALS — BP 132/84 | HR 77 | Ht 60.0 in | Wt 179.0 lb

## 2023-06-01 DIAGNOSIS — Z1231 Encounter for screening mammogram for malignant neoplasm of breast: Secondary | ICD-10-CM

## 2023-06-01 DIAGNOSIS — E782 Mixed hyperlipidemia: Secondary | ICD-10-CM | POA: Diagnosis not present

## 2023-06-01 DIAGNOSIS — E1169 Type 2 diabetes mellitus with other specified complication: Secondary | ICD-10-CM

## 2023-06-01 DIAGNOSIS — I152 Hypertension secondary to endocrine disorders: Secondary | ICD-10-CM

## 2023-06-01 DIAGNOSIS — E1159 Type 2 diabetes mellitus with other circulatory complications: Secondary | ICD-10-CM

## 2023-06-01 DIAGNOSIS — E1165 Type 2 diabetes mellitus with hyperglycemia: Secondary | ICD-10-CM

## 2023-06-01 LAB — POCT CBG (FASTING - GLUCOSE)-MANUAL ENTRY: Glucose Fasting, POC: 182 mg/dL — AB (ref 70–99)

## 2023-06-01 NOTE — Progress Notes (Signed)
 Established Patient Office Visit  Subjective:  Patient ID: Briana Swanson, female    DOB: February 25, 1955  Age: 68 y.o. MRN: [id number]  Chief Complaint  Patient presents with   Follow-up    4 month follow up    Patient comes in for her follow-up today accompanied by her daughter.  She recently returned from a trip overseas but was able able to continue her medications along with a diet control.  Generally she feels well and has no new complaints.  She will return fasting for her blood work. Patient needs to be scheduled for mammogram.    No other concerns at this time.   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    Essential hypertension, benign    GERD with esophagitis    Mixed hyperlipidemia    Type 2 diabetes mellitus with hyperglycemia (HCC)     Past Surgical History:  Procedure Laterality Date   ABDOMINAL HYSTERECTOMY      Social History   Socioeconomic History   Marital status: Widowed    Spouse name: Not on file   Number of children: Not on file   Years of education: Not on file   Highest education level: Not on file  Occupational History   Not on file  Tobacco Use   Smoking status: Never   Smokeless tobacco: Never  Substance and Sexual Activity   Alcohol use: Never   Drug use: Never   Sexual activity: Not on file  Other Topics Concern   Not on file  Social History Narrative   Not on file   Social Drivers of Health   Financial Resource Strain: Not on file  Food Insecurity: Not on file  Transportation Needs: Not on file  Physical Activity: Not on file  Stress: Not on file  Social Connections: Not on file  Intimate Partner Violence: Not on file    Family History  Problem Relation Age of Onset   Stroke Sister    Heart disease Brother     No Known Allergies  Outpatient Medications Prior to Visit  Medication Sig   amLODipine  (NORVASC ) 5 MG tablet Take 1 tablet (5 mg total) by mouth daily.   aspirin  EC 81 MG tablet Take 1  tablet (81 mg total) by mouth daily. Swallow whole.   atorvastatin  (LIPITOR) 80 MG tablet Take 1 tablet (80 mg total) by mouth daily.   metFORMIN  (GLUCOPHAGE -XR) 500 MG 24 hr tablet Take 1 tablet (500 mg total) by mouth daily with breakfast.   pantoprazole  (PROTONIX ) 40 MG tablet Take 1 tablet (40 mg total) by mouth daily.   No facility-administered medications prior to visit.    Review of Systems  Constitutional: Negative.  Negative for chills, fever and weight loss.  HENT: Negative.  Negative for sore throat.   Eyes: Negative.   Respiratory: Negative.  Negative for cough and shortness of breath.   Cardiovascular: Negative.  Negative for chest pain, palpitations and leg swelling.  Gastrointestinal: Negative.  Negative for abdominal pain, constipation, diarrhea, heartburn, nausea and vomiting.  Genitourinary: Negative.  Negative for dysuria and flank pain.  Musculoskeletal: Negative.  Negative for joint pain and myalgias.  Skin: Negative.   Neurological: Negative.  Negative for dizziness, tingling, tremors and headaches.  Endo/Heme/Allergies: Negative.   Psychiatric/Behavioral: Negative.  Negative for depression and suicidal ideas. The patient is not nervous/anxious.        Objective:   BP 132/84   Pulse 77   Ht 5' (  1.524 m)   Wt 179 lb (81.2 kg)   SpO2 96%   BMI 34.96 kg/m   Vitals:   06/01/23 0958  BP: 132/84  Pulse: 77  Height: 5' (1.524 m)  Weight: 179 lb (81.2 kg)  SpO2: 96%  BMI (Calculated): 34.96    Physical Exam Vitals and nursing note reviewed.  Constitutional:      Appearance: Normal appearance.  HENT:     Head: Normocephalic and atraumatic.     Nose: Nose normal.     Mouth/Throat:     Mouth: Mucous membranes are moist.     Pharynx: Oropharynx is clear.  Eyes:     Conjunctiva/sclera: Conjunctivae normal.     Pupils: Pupils are equal, round, and reactive to light.  Cardiovascular:     Rate and Rhythm: Normal rate and regular rhythm.     Pulses:  Normal pulses.     Heart sounds: Normal heart sounds. No murmur heard. Pulmonary:     Effort: Pulmonary effort is normal.     Breath sounds: Normal breath sounds. No wheezing.  Abdominal:     General: Bowel sounds are normal.     Palpations: Abdomen is soft.     Tenderness: There is no abdominal tenderness. There is no right CVA tenderness or left CVA tenderness.  Musculoskeletal:        General: Normal range of motion.     Cervical back: Normal range of motion.     Right lower leg: No edema.     Left lower leg: No edema.  Skin:    General: Skin is warm and dry.  Neurological:     General: No focal deficit present.     Mental Status: She is alert and oriented to person, place, and time.  Psychiatric:        Mood and Affect: Mood normal.        Behavior: Behavior normal.      Results for orders placed or performed in visit on 06/01/23  POCT CBG (Fasting - Glucose)  Result Value Ref Range   Glucose Fasting, POC 182 (A) 70 - 99 mg/dL    Recent Results (from the past 2160 hours)  POCT CBG (Fasting - Glucose)     Status: Abnormal   Collection Time: 06/01/23 10:02 AM  Result Value Ref Range   Glucose Fasting, POC 182 (A) 70 - 99 mg/dL      Assessment & Plan:  Continue current medications along with strict diet control.  Schedule mammogram.  Fasting labs. Problem List Items Addressed This Visit     Type 2 diabetes mellitus with hyperglycemia, without long-term current use of insulin (HCC)   Relevant Orders   POCT CBG (Fasting - Glucose) (Completed)   Hemoglobin A1c   Hypertension associated with diabetes (HCC) - Primary   Relevant Orders   CMP14+EGFR   CBC with Diff   Combined hyperlipidemia associated with type 2 diabetes mellitus (HCC)   Relevant Orders   Lipid Panel w/o Chol/HDL Ratio   Other Visit Diagnoses       Breast cancer screening by mammogram       Relevant Orders   MM 3D SCREENING MAMMOGRAM BILATERAL BREAST       Return in about 4 months (around  10/02/2023).   Total time spent: 30 minutes  Aisha Hove, MD  06/01/2023   This document may have been prepared by [facility] Voice Recognition software and as such may include unintentional dictation errors.

## 2023-10-02 ENCOUNTER — Ambulatory Visit (INDEPENDENT_AMBULATORY_CARE_PROVIDER_SITE_OTHER): Admitting: Internal Medicine

## 2023-10-02 ENCOUNTER — Ambulatory Visit: Payer: Self-pay | Admitting: Internal Medicine

## 2023-10-02 ENCOUNTER — Encounter: Payer: Self-pay | Admitting: Internal Medicine

## 2023-10-02 VITALS — BP 158/100 | HR 70 | Ht 60.0 in | Wt 182.6 lb

## 2023-10-02 DIAGNOSIS — I152 Hypertension secondary to endocrine disorders: Secondary | ICD-10-CM

## 2023-10-02 DIAGNOSIS — E782 Mixed hyperlipidemia: Secondary | ICD-10-CM

## 2023-10-02 DIAGNOSIS — E1165 Type 2 diabetes mellitus with hyperglycemia: Secondary | ICD-10-CM

## 2023-10-02 DIAGNOSIS — I10 Essential (primary) hypertension: Secondary | ICD-10-CM | POA: Diagnosis not present

## 2023-10-02 DIAGNOSIS — E1159 Type 2 diabetes mellitus with other circulatory complications: Secondary | ICD-10-CM | POA: Diagnosis not present

## 2023-10-02 DIAGNOSIS — K219 Gastro-esophageal reflux disease without esophagitis: Secondary | ICD-10-CM

## 2023-10-02 DIAGNOSIS — E1169 Type 2 diabetes mellitus with other specified complication: Secondary | ICD-10-CM

## 2023-10-02 LAB — POCT CBG (FASTING - GLUCOSE)-MANUAL ENTRY: Glucose Fasting, POC: 113 mg/dL — AB (ref 70–99)

## 2023-10-02 MED ORDER — PANTOPRAZOLE SODIUM 40 MG PO TBEC: 40.0000 mg | DELAYED_RELEASE_TABLET | Freq: Every day | ORAL | 3 refills | Status: AC

## 2023-10-02 MED ORDER — AMLODIPINE BESYLATE 5 MG PO TABS: 5.0000 mg | ORAL_TABLET | Freq: Every day | ORAL | 3 refills | Status: AC

## 2023-10-02 MED ORDER — METFORMIN HCL ER 500 MG PO TB24: 500.0000 mg | ORAL_TABLET | Freq: Every day | ORAL | 3 refills | Status: AC

## 2023-10-02 MED ORDER — ATORVASTATIN CALCIUM 80 MG PO TABS: 80.0000 mg | ORAL_TABLET | Freq: Every day | ORAL | 3 refills | Status: AC

## 2023-10-02 NOTE — Progress Notes (Signed)
 Established Patient Office Visit  Subjective:  Patient ID: Briana Swanson, female    DOB: 04/20/1955  Age: 68 y.o. MRN: [id number]  Chief Complaint  Patient presents with   Follow-up    4 month follow up    Patient comes in for follow up , accompanied by her daughter. BP is high today, reports that she ran out of her medication- will pick up today. Fasting for labs. No other complaints.    No other concerns at this time.   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    Essential hypertension, benign    GERD with esophagitis    Mixed hyperlipidemia    Type 2 diabetes mellitus with hyperglycemia (HCC)     Past Surgical History:  Procedure Laterality Date   ABDOMINAL HYSTERECTOMY      Social History   Socioeconomic History   Marital status: Widowed    Spouse name: Not on file   Number of children: Not on file   Years of education: Not on file   Highest education level: Not on file  Occupational History   Not on file  Tobacco Use   Smoking status: Never   Smokeless tobacco: Never  Substance and Sexual Activity   Alcohol use: Never   Drug use: Never   Sexual activity: Not on file  Other Topics Concern   Not on file  Social History Narrative   Not on file   Social Drivers of Health   Financial Resource Strain: Not on file  Food Insecurity: Not on file  Transportation Needs: Not on file  Physical Activity: Not on file  Stress: Not on file  Social Connections: Not on file  Intimate Partner Violence: Not on file    Family History  Problem Relation Age of Onset   Stroke Sister    Heart disease Brother     No Known Allergies  Outpatient Medications Prior to Visit  Medication Sig   aspirin  EC 81 MG tablet Take 1 tablet (81 mg total) by mouth daily. Swallow whole.   [DISCONTINUED] amLODipine  (NORVASC ) 5 MG tablet Take 1 tablet (5 mg total) by mouth daily.   [DISCONTINUED] atorvastatin  (LIPITOR) 80 MG tablet Take 1 tablet (80 mg total)  by mouth daily.   [DISCONTINUED] metFORMIN  (GLUCOPHAGE -XR) 500 MG 24 hr tablet Take 1 tablet (500 mg total) by mouth daily with breakfast.   [DISCONTINUED] pantoprazole  (PROTONIX ) 40 MG tablet Take 1 tablet (40 mg total) by mouth daily.   No facility-administered medications prior to visit.    Review of Systems  Constitutional: Negative.  Negative for chills, fever and malaise/fatigue.  HENT: Negative.  Negative for congestion and sore throat.   Eyes: Negative.  Negative for blurred vision and pain.  Respiratory: Negative.  Negative for cough and shortness of breath.   Cardiovascular: Negative.  Negative for chest pain, palpitations and leg swelling.  Gastrointestinal: Negative.  Negative for abdominal pain, blood in stool, constipation, diarrhea, heartburn, melena, nausea and vomiting.  Genitourinary: Negative.  Negative for dysuria, flank pain, frequency and urgency.  Musculoskeletal: Negative.  Negative for joint pain and myalgias.  Skin: Negative.   Neurological: Negative.  Negative for dizziness, tingling, sensory change, weakness and headaches.  Endo/Heme/Allergies: Negative.   Psychiatric/Behavioral: Negative.  Negative for depression and suicidal ideas. The patient is not nervous/anxious.        Objective:   BP (!) 158/100   Pulse 70   Ht 5' (1.524 m)   [employer]  182 lb 9.6 oz (82.8 kg)   SpO2 99%   BMI 35.66 kg/m   Vitals:   10/02/23 1019  BP: (!) 158/100  Pulse: 70  Height: 5' (1.524 m)  Weight: 182 lb 9.6 oz (82.8 kg)  SpO2: 99%  BMI (Calculated): 35.66    Physical Exam Vitals and nursing note reviewed.  Constitutional:      Appearance: Normal appearance.  HENT:     Head: Normocephalic and atraumatic.     Nose: Nose normal.     Mouth/Throat:     Mouth: Mucous membranes are moist.     Pharynx: Oropharynx is clear.  Eyes:     Conjunctiva/sclera: Conjunctivae normal.     Pupils: Pupils are equal, round, and reactive to light.  Cardiovascular:     Rate and  Rhythm: Normal rate and regular rhythm.     Pulses: Normal pulses.     Heart sounds: Normal heart sounds. No murmur heard. Pulmonary:     Effort: Pulmonary effort is normal.     Breath sounds: Normal breath sounds. No wheezing.  Abdominal:     General: Bowel sounds are normal.     Palpations: Abdomen is soft.     Tenderness: There is no abdominal tenderness. There is no right CVA tenderness or left CVA tenderness.  Musculoskeletal:        General: Normal range of motion.     Cervical back: Normal range of motion.     Right lower leg: No edema.     Left lower leg: No edema.  Skin:    General: Skin is warm and dry.  Neurological:     General: No focal deficit present.     Mental Status: She is alert and oriented to person, place, and time.  Psychiatric:        Mood and Affect: Mood normal.        Behavior: Behavior normal.      Results for orders placed or performed in visit on 10/02/23  POCT CBG (Fasting - Glucose)  Result Value Ref Range   Glucose Fasting, POC 113 (A) 70 - 99 mg/dL    Recent Results (from the past 2160 hours)  POCT CBG (Fasting - Glucose)     Status: Abnormal   Collection Time: 10/02/23 10:25 AM  Result Value Ref Range   Glucose Fasting, POC 113 (A) 70 - 99 mg/dL      Assessment & Plan:  Continue all meds. Refills sent. Check labs today. Follow up to check BP. Problem List Items Addressed This Visit     Type 2 diabetes mellitus with hyperglycemia, without long-term current use of insulin (HCC)   Relevant Medications   atorvastatin  (LIPITOR) 80 MG tablet   metFORMIN  (GLUCOPHAGE -XR) 500 MG 24 hr tablet   Other Relevant Orders   POCT CBG (Fasting - Glucose) (Completed)   Hemoglobin A1c   Ambulatory referral to Ophthalmology   Mixed hyperlipidemia   Relevant Medications   amLODipine  (NORVASC ) 5 MG tablet   atorvastatin  (LIPITOR) 80 MG tablet   Essential hypertension, benign   Relevant Medications   amLODipine  (NORVASC ) 5 MG tablet    atorvastatin  (LIPITOR) 80 MG tablet   Gastroesophageal reflux disease without esophagitis   Relevant Medications   pantoprazole  (PROTONIX ) 40 MG tablet   Hypertension associated with diabetes (HCC) - Primary   Relevant Medications   amLODipine  (NORVASC ) 5 MG tablet   atorvastatin  (LIPITOR) 80 MG tablet   metFORMIN  (GLUCOPHAGE -XR) 500 MG 24 hr tablet  Other Relevant Orders   CMP14+EGFR   CBC with Diff   Combined hyperlipidemia associated with type 2 diabetes mellitus (HCC)   Relevant Medications   amLODipine  (NORVASC ) 5 MG tablet   atorvastatin  (LIPITOR) 80 MG tablet   metFORMIN  (GLUCOPHAGE -XR) 500 MG 24 hr tablet   Other Relevant Orders   Lipid Panel w/o Chol/HDL Ratio    Return in about 3 weeks (around 10/23/2023).   Total time spent: 30 minutes  FERNAND FREDY RAMAN, MD  10/02/2023   This document may have been prepared by [facility] Voice Recognition software and as such may include unintentional dictation errors.

## 2023-10-03 LAB — CBC WITH DIFFERENTIAL/PLATELET
Basophils Absolute: 0 x10E3/uL (ref 0.0–0.2)
Basos: 0 %
EOS (ABSOLUTE): 0.2 x10E3/uL (ref 0.0–0.4)
Eos: 2 %
Hematocrit: 40.5 % (ref 34.0–46.6)
Hemoglobin: 12.7 g/dL (ref 11.1–15.9)
Immature Grans (Abs): 0 x10E3/uL (ref 0.0–0.1)
Immature Granulocytes: 0 %
Lymphocytes Absolute: 2.1 x10E3/uL (ref 0.7–3.1)
Lymphs: 32 %
MCH: 26.6 pg (ref 26.6–33.0)
MCHC: 31.4 g/dL — ABNORMAL LOW (ref 31.5–35.7)
MCV: 85 fL (ref 79–97)
Monocytes Absolute: 0.5 x10E3/uL (ref 0.1–0.9)
Monocytes: 8 %
Neutrophils Absolute: 3.9 x10E3/uL (ref 1.4–7.0)
Neutrophils: 58 %
Platelets: 227 x10E3/uL (ref 150–450)
RBC: 4.78 x10E6/uL (ref 3.77–5.28)
RDW: 13.9 % (ref 11.7–15.4)
WBC: 6.8 x10E3/uL (ref 3.4–10.8)

## 2023-10-03 LAB — CMP14+EGFR
ALT: 23 IU/L (ref 0–32)
AST: 25 IU/L (ref 0–40)
Albumin: 4.2 g/dL (ref 3.9–4.9)
Alkaline Phosphatase: 76 IU/L (ref 49–135)
BUN/Creatinine Ratio: 21 (ref 12–28)
BUN: 15 mg/dL (ref 8–27)
Bilirubin Total: 0.4 mg/dL (ref 0.0–1.2)
CO2: 23 mmol/L (ref 20–29)
Calcium: 9.4 mg/dL (ref 8.7–10.3)
Chloride: 102 mmol/L (ref 96–106)
Creatinine, Ser: 0.73 mg/dL (ref 0.57–1.00)
Globulin, Total: 2.7 g/dL (ref 1.5–4.5)
Glucose: 91 mg/dL (ref 70–99)
Potassium: 4.3 mmol/L (ref 3.5–5.2)
Sodium: 139 mmol/L (ref 134–144)
Total Protein: 6.9 g/dL (ref 6.0–8.5)
eGFR: 89 mL/min/1.73 (ref >59)

## 2023-10-03 LAB — LIPID PANEL W/O CHOL/HDL RATIO
Cholesterol, Total: 135 mg/dL (ref 100–199)
HDL: 50 mg/dL (ref >39)
LDL Chol Calc (NIH): 65 mg/dL (ref 0–99)
Triglycerides: 109 mg/dL (ref 0–149)
VLDL Cholesterol Cal: 20 mg/dL (ref 5–40)

## 2023-10-03 LAB — HEMOGLOBIN A1C
Est. average glucose Bld gHb Est-mCnc: 146 mg/dL
Hgb A1c MFr Bld: 6.7 % — ABNORMAL HIGH (ref 4.8–5.6)

## 2023-10-11 ENCOUNTER — Encounter: Payer: Self-pay | Admitting: Internal Medicine

## 2023-10-23 ENCOUNTER — Ambulatory Visit: Admitting: Internal Medicine

## 2023-10-23 ENCOUNTER — Ambulatory Visit: Payer: Self-pay | Admitting: Internal Medicine

## 2023-10-23 ENCOUNTER — Encounter: Payer: Self-pay | Admitting: Internal Medicine

## 2023-10-23 VITALS — BP 132/62 | HR 75 | Ht 60.0 in | Wt 183.0 lb

## 2023-10-23 DIAGNOSIS — E782 Mixed hyperlipidemia: Secondary | ICD-10-CM | POA: Diagnosis not present

## 2023-10-23 DIAGNOSIS — E1159 Type 2 diabetes mellitus with other circulatory complications: Secondary | ICD-10-CM

## 2023-10-23 DIAGNOSIS — I152 Hypertension secondary to endocrine disorders: Secondary | ICD-10-CM

## 2023-10-23 DIAGNOSIS — Z23 Encounter for immunization: Secondary | ICD-10-CM | POA: Diagnosis not present

## 2023-10-23 DIAGNOSIS — E1169 Type 2 diabetes mellitus with other specified complication: Secondary | ICD-10-CM

## 2023-10-23 DIAGNOSIS — K219 Gastro-esophageal reflux disease without esophagitis: Secondary | ICD-10-CM

## 2023-10-23 DIAGNOSIS — E1165 Type 2 diabetes mellitus with hyperglycemia: Secondary | ICD-10-CM | POA: Diagnosis not present

## 2023-10-23 LAB — POCT CBG (FASTING - GLUCOSE)-MANUAL ENTRY: Glucose Fasting, POC: 125 mg/dL — AB (ref 70–99)

## 2023-10-23 NOTE — Progress Notes (Signed)
 Established Patient Office Visit  Subjective:  Patient ID: Briana Swanson, female    DOB: 04-16-1955  Age: 68 y.o. MRN: [id number]  Chief Complaint  Patient presents with   Follow-up    3 week follow up    Patient comes in for follow up of her BP. It was elevated at her last visit as she had run out of Amlodipine . Today it is much better. Generally feels well, no new complaints. Labs discussed, Hgba1c went up a little- needs stricter diet control.    No other concerns at this time.   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    Essential hypertension, benign    GERD with esophagitis    Mixed hyperlipidemia    Type 2 diabetes mellitus with hyperglycemia (HCC)     Past Surgical History:  Procedure Laterality Date   ABDOMINAL HYSTERECTOMY      Social History   Socioeconomic History   Marital status: Widowed    Spouse name: Not on file   Number of children: Not on file   Years of education: Not on file   Highest education level: Not on file  Occupational History   Not on file  Tobacco Use   Smoking status: Never   Smokeless tobacco: Never  Substance and Sexual Activity   Alcohol use: Never   Drug use: Never   Sexual activity: Not on file  Other Topics Concern   Not on file  Social History Narrative   Not on file   Social Drivers of Health   Financial Resource Strain: Not on file  Food Insecurity: Not on file  Transportation Needs: Not on file  Physical Activity: Not on file  Stress: Not on file  Social Connections: Not on file  Intimate Partner Violence: Not on file    Family History  Problem Relation Age of Onset   Stroke Sister    Heart disease Brother     No Known Allergies  Outpatient Medications Prior to Visit  Medication Sig   amLODipine  (NORVASC ) 5 MG tablet Take 1 tablet (5 mg total) by mouth daily.   aspirin  EC 81 MG tablet Take 1 tablet (81 mg total) by mouth daily. Swallow whole.   atorvastatin  (LIPITOR) 80 MG  tablet Take 1 tablet (80 mg total) by mouth daily.   metFORMIN  (GLUCOPHAGE -XR) 500 MG 24 hr tablet Take 1 tablet (500 mg total) by mouth daily with breakfast.   pantoprazole  (PROTONIX ) 40 MG tablet Take 1 tablet (40 mg total) by mouth daily.   No facility-administered medications prior to visit.    Review of Systems  Constitutional: Negative.  Negative for chills, fever and malaise/fatigue.  HENT: Negative.  Negative for congestion and sore throat.   Eyes: Negative.  Negative for blurred vision and pain.  Respiratory: Negative.  Negative for cough and shortness of breath.   Cardiovascular: Negative.  Negative for chest pain, palpitations and leg swelling.  Gastrointestinal: Negative.  Negative for abdominal pain, blood in stool, constipation, diarrhea, heartburn, melena, nausea and vomiting.  Genitourinary: Negative.  Negative for dysuria, flank pain, frequency and urgency.  Musculoskeletal: Negative.  Negative for joint pain and myalgias.  Skin: Negative.   Neurological: Negative.  Negative for dizziness, tingling, sensory change, weakness and headaches.  Endo/Heme/Allergies: Negative.   Psychiatric/Behavioral: Negative.  Negative for depression and suicidal ideas. The patient is not nervous/anxious.        Objective:   BP 132/62   Pulse 75  Ht 5' (1.524 m)   Wt 183 lb (83 kg)   SpO2 95%   BMI 35.74 kg/m   Vitals:   10/23/23 1116  BP: 132/62  Pulse: 75  Height: 5' (1.524 m)  Weight: 183 lb (83 kg)  SpO2: 95%  BMI (Calculated): 35.74    Physical Exam   Results for orders placed or performed in visit on 10/23/23  POCT CBG (Fasting - Glucose)  Result Value Ref Range   Glucose Fasting, POC 125 (A) 70 - 99 mg/dL    Recent Results (from the past 2160 hours)  POCT CBG (Fasting - Glucose)     Status: Abnormal   Collection Time: 10/02/23 10:25 AM  Result Value Ref Range   Glucose Fasting, POC 113 (A) 70 - 99 mg/dL  [id number]     Status: None   Collection Time:  10/02/23 10:57 AM  Result Value Ref Range   Glucose 91 70 - 99 mg/dL   BUN 15 8 - 27 mg/dL   Creatinine, Ser 9.26 0.57 - 1.00 mg/dL   eGFR 89 >40 fO/fpw/8.26   BUN/Creatinine Ratio 21 12 - 28   Sodium 139 134 - 144 mmol/L   Potassium 4.3 3.5 - 5.2 mmol/L   Chloride 102 96 - 106 mmol/L   CO2 23 20 - 29 mmol/L   Calcium  9.4 8.7 - 10.3 mg/dL   Total Protein 6.9 6.0 - 8.5 g/dL   Albumin 4.2 3.9 - 4.9 g/dL   Globulin, Total 2.7 1.5 - 4.5 g/dL   Bilirubin Total 0.4 0.0 - 1.2 mg/dL   Alkaline Phosphatase 76 49 - 135 IU/L    Comment:               **Please note reference interval change**   AST 25 0 - 40 IU/L   ALT 23 0 - 32 IU/L  Lipid Panel w/o Chol/HDL Ratio     Status: None   Collection Time: 10/02/23 10:57 AM  Result Value Ref Range   Cholesterol, Total 135 100 - 199 mg/dL   Triglycerides 890 0 - 149 mg/dL   HDL 50 >60 mg/dL   VLDL Cholesterol Cal 20 5 - 40 mg/dL   LDL Chol Calc (NIH) 65 0 - 99 mg/dL  CBC with Diff     Status: Abnormal   Collection Time: 10/02/23 10:57 AM  Result Value Ref Range   WBC 6.8 3.4 - 10.8 x10E3/uL   RBC 4.78 3.77 - 5.28 x10E6/uL   Hemoglobin 12.7 11.1 - 15.9 g/dL   Hematocrit 59.4 65.9 - 46.6 %   MCV 85 79 - 97 fL   MCH 26.6 26.6 - 33.0 pg   MCHC 31.4 (L) 31.5 - 35.7 g/dL   RDW 86.0 88.2 - 84.5 %   Platelets 227 150 - 450 x10E3/uL   Neutrophils 58 Not Estab. %   Lymphs 32 Not Estab. %   Monocytes 8 Not Estab. %   Eos 2 Not Estab. %   Basos 0 Not Estab. %   Neutrophils Absolute 3.9 1.4 - 7.0 x10E3/uL   Lymphocytes Absolute 2.1 0.7 - 3.1 x10E3/uL   Monocytes Absolute 0.5 0.1 - 0.9 x10E3/uL   EOS (ABSOLUTE) 0.2 0.0 - 0.4 x10E3/uL   Basophils Absolute 0.0 0.0 - 0.2 x10E3/uL   Immature Granulocytes 0 Not Estab. %   Immature Grans (Abs) 0.0 0.0 - 0.1 x10E3/uL  Hemoglobin A1c     Status: Abnormal   Collection Time: 10/02/23 10:57 AM  Result Value Ref Range  Hgb A1c MFr Bld 6.7 (H) 4.8 - 5.6 %    Comment:          Prediabetes: 5.7 - 6.4           Diabetes: >6.4          Glycemic control for adults with diabetes: <7.0    Est. average glucose Bld gHb Est-mCnc 146 mg/dL  POCT CBG (Fasting - Glucose)     Status: Abnormal   Collection Time: 10/23/23 11:20 AM  Result Value Ref Range   Glucose Fasting, POC 125 (A) 70 - 99 mg/dL    Comment: non-fasting      Assessment & Plan:  Continue current meds. Strict diet control. Flu shot today. Problem List Items Addressed This Visit     Type 2 diabetes mellitus with hyperglycemia, without long-term current use of insulin (HCC) - Primary   Relevant Orders   POCT CBG (Fasting - Glucose) (Completed)   Gastroesophageal reflux disease without esophagitis   Hypertension associated with diabetes (HCC)   Combined hyperlipidemia associated with type 2 diabetes mellitus (HCC)   Other Visit Diagnoses       Needs flu shot       Relevant Orders   Flu Vaccine Trivalent High Dose (Fluad) (Completed)       Return in about 3 months (around 01/23/2024).   Total time spent: 25 minutes  FERNAND FREDY RAMAN, MD  10/23/2023   This document may have been prepared by [facility] Voice Recognition software and as such may include unintentional dictation errors.

## 2024-01-29 ENCOUNTER — Ambulatory Visit: Admitting: Internal Medicine

## 2024-02-12 ENCOUNTER — Ambulatory Visit: Admitting: Internal Medicine

## 2024-02-12 ENCOUNTER — Ambulatory Visit

## 2024-02-19 ENCOUNTER — Ambulatory Visit: Admitting: Internal Medicine
